# Patient Record
Sex: Female | Born: 1955 | Race: White | Hispanic: No | Marital: Married | Smoking: Never smoker
Health system: Southern US, Community
[De-identification: ages and names within clinical notes are randomized; demographics above are authoritative.]

## PROBLEM LIST (undated history)

## (undated) DIAGNOSIS — E785 Hyperlipidemia, unspecified: Secondary | ICD-10-CM

## (undated) DIAGNOSIS — Z5189 Encounter for other specified aftercare: Secondary | ICD-10-CM

## (undated) DIAGNOSIS — D34 Benign neoplasm of thyroid gland: Secondary | ICD-10-CM

## (undated) DIAGNOSIS — G43909 Migraine, unspecified, not intractable, without status migrainosus: Secondary | ICD-10-CM

## (undated) DIAGNOSIS — C801 Malignant (primary) neoplasm, unspecified: Secondary | ICD-10-CM

## (undated) DIAGNOSIS — R32 Unspecified urinary incontinence: Secondary | ICD-10-CM

## (undated) DIAGNOSIS — R011 Cardiac murmur, unspecified: Secondary | ICD-10-CM

## (undated) DIAGNOSIS — E271 Primary adrenocortical insufficiency: Secondary | ICD-10-CM

## (undated) HISTORY — DX: Encounter for other specified aftercare: Z51.89

## (undated) HISTORY — DX: Cardiac murmur, unspecified: R01.1

## (undated) HISTORY — DX: Benign neoplasm of thyroid gland: D34

## (undated) HISTORY — DX: Hyperlipidemia, unspecified: E78.5

## (undated) HISTORY — DX: Migraine, unspecified, not intractable, without status migrainosus: G43.909

## (undated) HISTORY — PX: ABDOMINAL HYSTERECTOMY: SHX81

## (undated) HISTORY — DX: Unspecified urinary incontinence: R32

## (undated) HISTORY — PX: LYMPHADENECTOMY: SHX15

---

## 2008-05-05 DIAGNOSIS — Z923 Personal history of irradiation: Secondary | ICD-10-CM

## 2008-05-05 HISTORY — DX: Personal history of irradiation: Z92.3

## 2008-05-05 HISTORY — PX: BREAST LUMPECTOMY: SHX2

## 2011-02-10 DIAGNOSIS — C50919 Malignant neoplasm of unspecified site of unspecified female breast: Secondary | ICD-10-CM | POA: Insufficient documentation

## 2011-02-18 DIAGNOSIS — F419 Anxiety disorder, unspecified: Secondary | ICD-10-CM | POA: Insufficient documentation

## 2011-02-18 DIAGNOSIS — E890 Postprocedural hypothyroidism: Secondary | ICD-10-CM | POA: Insufficient documentation

## 2011-02-18 DIAGNOSIS — E271 Primary adrenocortical insufficiency: Secondary | ICD-10-CM | POA: Insufficient documentation

## 2015-05-06 HISTORY — PX: WRIST FRACTURE SURGERY: SHX121

## 2016-01-06 ENCOUNTER — Encounter (HOSPITAL_COMMUNITY): Payer: Self-pay | Admitting: Emergency Medicine

## 2016-01-06 ENCOUNTER — Emergency Department (HOSPITAL_COMMUNITY): Payer: BLUE CROSS/BLUE SHIELD

## 2016-01-06 ENCOUNTER — Emergency Department (HOSPITAL_COMMUNITY)
Admission: EM | Admit: 2016-01-06 | Discharge: 2016-01-06 | Disposition: A | Discharge status: Home / Self Care | Payer: BLUE CROSS/BLUE SHIELD | Attending: Emergency Medicine | Admitting: Emergency Medicine

## 2016-01-06 DIAGNOSIS — S6992XA Unspecified injury of left wrist, hand and finger(s), initial encounter: Secondary | ICD-10-CM | POA: Diagnosis present

## 2016-01-06 DIAGNOSIS — S52572A Other intraarticular fracture of lower end of left radius, initial encounter for closed fracture: Secondary | ICD-10-CM | POA: Diagnosis not present

## 2016-01-06 DIAGNOSIS — Y9301 Activity, walking, marching and hiking: Secondary | ICD-10-CM | POA: Insufficient documentation

## 2016-01-06 DIAGNOSIS — Y999 Unspecified external cause status: Secondary | ICD-10-CM | POA: Diagnosis not present

## 2016-01-06 DIAGNOSIS — Y929 Unspecified place or not applicable: Secondary | ICD-10-CM | POA: Insufficient documentation

## 2016-01-06 DIAGNOSIS — Z853 Personal history of malignant neoplasm of breast: Secondary | ICD-10-CM | POA: Diagnosis not present

## 2016-01-06 DIAGNOSIS — W010XXA Fall on same level from slipping, tripping and stumbling without subsequent striking against object, initial encounter: Secondary | ICD-10-CM | POA: Diagnosis not present

## 2016-01-06 DIAGNOSIS — S62102A Fracture of unspecified carpal bone, left wrist, initial encounter for closed fracture: Secondary | ICD-10-CM

## 2016-01-06 HISTORY — DX: Malignant (primary) neoplasm, unspecified: C80.1

## 2016-01-06 HISTORY — DX: Primary adrenocortical insufficiency: E27.1

## 2016-01-06 MED ORDER — TRAMADOL HCL 50 MG PO TABS: 50.0000 mg | ORAL_TABLET | Freq: Four times a day (QID) | ORAL | 0 refills | Status: DC | PRN

## 2016-01-06 MED ORDER — ONDANSETRON 4 MG PO TBDP: 4.0000 mg | ORAL_TABLET | Freq: Three times a day (TID) | ORAL | 0 refills | Status: DC | PRN

## 2016-01-06 MED ORDER — IBUPROFEN 400 MG PO TABS: 400.0000 mg | ORAL_TABLET | Freq: Four times a day (QID) | ORAL | 0 refills | Status: AC | PRN

## 2016-01-06 MED ORDER — ACETAMINOPHEN 325 MG PO TABS
650.0000 mg | ORAL_TABLET | Freq: Once | ORAL | Status: AC
  Administered 2016-01-06: 650 mg via ORAL
  Filled 2016-01-06: qty 2

## 2016-01-06 MED ORDER — ONDANSETRON 4 MG PO TBDP: 4.0000 mg | ORAL_TABLET | Freq: Three times a day (TID) | ORAL | 0 refills | Status: AC | PRN

## 2016-01-06 MED ORDER — ACETAMINOPHEN ER 650 MG PO TBCR: 650.0000 mg | EXTENDED_RELEASE_TABLET | Freq: Three times a day (TID) | ORAL | 0 refills | Status: DC | PRN

## 2016-01-06 NOTE — ED Provider Notes (Signed)
[facility] DEPT Provider Note   CSN: [id number] Arrival date & time: 01/06/16  1444     History   Chief Complaint Chief Complaint  Patient presents with  . Fall  . Arm Injury    HPI Tiffany Costa is a 60 y.o. female.  HPI Pt comes in post fall. Pt had a trip and fell on to an outstretched hand. Pt had instant swelling and pain to the wrist L side, she is R handed. Denies pain elsewhere. No head trauma. Ambulating w/o trouble.   Past Medical History:  Diagnosis Date  . 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 Cancer [facility])    breast cancer    There are no active problems to display for this patient.   Past Surgical History:  Procedure Laterality Date  . LYMPHADENECTOMY      OB History    No data available       Home Medications    Prior to Admission medications   Medication Sig Start Date End Date Taking? Authorizing Provider  acetaminophen (TYLENOL 8 HOUR) 650 MG CR tablet Take 1 tablet (650 mg total) by mouth every 8 (eight) hours as needed for pain. 01/06/16   Varney Biles, MD  ibuprofen (ADVIL,MOTRIN) 400 MG tablet Take 1 tablet (400 mg total) by mouth every 6 (six) hours as needed. 01/07/16   Varney Biles, MD  ondansetron (ZOFRAN ODT) 4 MG disintegrating tablet Take 1 tablet (4 mg total) by mouth every 8 (eight) hours as needed for nausea or vomiting. 01/06/16   Varney Biles, MD  ondansetron (ZOFRAN ODT) 4 MG disintegrating tablet Take 1 tablet (4 mg total) by mouth every 8 (eight) hours as needed for nausea or vomiting. 01/06/16   Varney Biles, MD  traMADol (ULTRAM) 50 MG tablet Take 1 tablet (50 mg total) by mouth every 6 (six) hours as needed. 01/06/16   Varney Biles, MD    Family History History reviewed. No pertinent family history.  Social History Social History  Substance Use Topics  . Smoking status: Never Smoker  . Smokeless tobacco: Never Used  . Alcohol use No     Allergies   Amoxicillin and Codeine   Review of Systems Review of Systems    Constitutional: Positive for activity change.  Musculoskeletal: Positive for arthralgias and myalgias.  Skin: Positive for wound. Negative for rash.  Neurological: Negative for headaches.  Hematological: Does not bruise/bleed easily.     Physical Exam Updated Vital Signs BP 136/75 (BP Location: Right Leg)   Pulse 77   Temp 98.1 F (36.7 C) (Oral)   Resp 16   SpO2 98%   Physical Exam  Constitutional: She is oriented to person, place, and time. She appears well-developed and well-nourished.  HENT:  Head: Normocephalic and atraumatic.  Eyes: EOM are normal. Pupils are equal, round, and reactive to light.  Neck: Neck supple.  Cardiovascular: Normal rate, regular rhythm and normal heart sounds.   No murmur heard. Pulmonary/Chest: Effort normal. No respiratory distress.  Abdominal: Soft. She exhibits no distension. There is no tenderness. There is no rebound and no guarding.  Musculoskeletal: She exhibits edema and tenderness.  L wrist tenderness  Neurological: She is alert and oriented to person, place, and time.  Skin: Skin is warm and dry.  Nursing note and vitals reviewed.    ED Treatments / Results  Labs (all labs ordered are listed, but only abnormal results are displayed) Labs Reviewed - No data to display  EKG  EKG Interpretation None  Radiology Dg Wrist Complete Left  Result Date: 01/06/2016 CLINICAL DATA:  Fall, left wrist pain EXAM: LEFT WRIST - COMPLETE 3+ VIEW COMPARISON:  None. FINDINGS: Comminuted, intra-articular distal radial fracture. Suspected nondisplaced ulnar styloid fracture. Associated soft tissue swelling. Degenerative changes of the 1st carpometacarpal joint. IMPRESSION: Comminuted intra-articular distal radial fracture. Nondisplaced ulnar styloid fracture. Electronically Signed   By: Julian Hy M.D.   On: 01/06/2016 15:38    Procedures Procedures (including critical care time)  Medications Ordered in ED Medications   acetaminophen (TYLENOL) tablet 650 mg (650 mg Oral Given 01/06/16 1603)     Initial Impression / Assessment and Plan / ED Course  I have reviewed the triage vital signs and the nursing notes.  Pertinent labs & imaging results that were available during my care of the patient were reviewed by me and considered in my medical decision making (see chart for details).  Clinical Course   Pt with Denning injury and resultant wrist fracture. We will place her in a sugar tong splint and d/c with hand surgery f/u. Pt has nausea with codeine and doesn't want anything too strong, we will give ultram and tylenol 650, and she can take motrin after the 1st day of injury.  Final Clinical Impressions(s) / ED Diagnoses   Final diagnoses:  Wrist fracture, closed, left, initial encounter    New Prescriptions Discharge Medication List as of 01/06/2016  4:31 PM    START taking these medications   Details  acetaminophen (TYLENOL 8 HOUR) 650 MG CR tablet Take 1 tablet (650 mg total) by mouth every 8 (eight) hours as needed for pain., Starting Sun 01/06/2016, Print    ibuprofen (ADVIL,MOTRIN) 400 MG tablet Take 1 tablet (400 mg total) by mouth every 6 (six) hours as needed., Starting Mon 01/07/2016, Print    ondansetron (ZOFRAN ODT) 4 MG disintegrating tablet Take 1 tablet (4 mg total) by mouth every 8 (eight) hours as needed for nausea or vomiting., Starting Sun 01/06/2016, Print    traMADol (ULTRAM) 50 MG tablet Take 1 tablet (50 mg total) by mouth every 6 (six) hours as needed., Starting Sun 01/06/2016, Print         Varney Biles, MD 01/06/16 [id number]

## 2016-01-06 NOTE — ED Notes (Signed)
Bed: WA25 Expected date:  Expected time:  Means of arrival:  Comments: EMS  Fall 

## 2016-01-06 NOTE — Discharge Instructions (Signed)
Please see the doctor as requested.

## 2016-01-06 NOTE — ED Notes (Signed)
Patient ambulatory to lobby with family. NAD noted.

## 2016-01-06 NOTE — ED Triage Notes (Signed)
Pt tripped over tree root when walking to the store. Pt fell backwards and tried to catch self with L arm. Pt has L wrist pain, splinted by EMS. Feels better after splint and ice.

## 2018-01-27 ENCOUNTER — Encounter: Payer: Self-pay | Admitting: Family Medicine

## 2018-01-27 ENCOUNTER — Ambulatory Visit (INDEPENDENT_AMBULATORY_CARE_PROVIDER_SITE_OTHER): Payer: BLUE CROSS/BLUE SHIELD | Admitting: Family Medicine

## 2018-01-27 VITALS — BP 130/78 | HR 100 | Temp 100.1°F | Ht 59.0 in | Wt 122.6 lb

## 2018-01-27 DIAGNOSIS — E890 Postprocedural hypothyroidism: Secondary | ICD-10-CM | POA: Diagnosis not present

## 2018-01-27 DIAGNOSIS — E271 Primary adrenocortical insufficiency: Secondary | ICD-10-CM | POA: Diagnosis not present

## 2018-01-27 DIAGNOSIS — J0100 Acute maxillary sinusitis, unspecified: Secondary | ICD-10-CM | POA: Diagnosis not present

## 2018-01-27 MED ORDER — DOXYCYCLINE HYCLATE 100 MG PO TABS: 100.0000 mg | ORAL_TABLET | Freq: Two times a day (BID) | ORAL | 0 refills | Status: AC

## 2018-01-27 NOTE — Progress Notes (Signed)
Subjective:    Patient ID: Tiffany Costa, female    DOB: [birth year], 62 y.o.   MRN: [id number]  HPI   Patient presents to clinic to establish with new PCP.  She recently moved to [place] from [place].  Patient's medical history, surgical history, social history, family history updated accordingly in chart.  Patient has history of Addison's disease (autoimmune type) for which she had radioactive iodine treatment and now has hypothyroidism due to this.  Patient would like a referral to endocrinologist in the local area, previously was seeing someone in [place], but that would be to forward dry for now.  Patient also had breast cancer, she is currently taking tamoxifen to prevent recurrence of breast cancer.  She follows with oncology regularly for this.  Mammogram is scheduled for October 2019.  Today she complains of severe sinus pressure, sinus headache, nasal drainage, feeling rundown.  States she has had the symptoms for little over a week, has tried some over-the-counter cold medication without much relief.   Patient Active Problem List   Diagnosis Date Noted  . 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 Anxiety 02/18/2011  . 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 Breast cancer, stage 1 ([place]) 02/10/2011   Past Medical History:  Diagnosis Date  . 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 Blood transfusion without reported diagnosis   . Cancer [facility])    breast cancer  . Heart murmur   . Hyperfunctioning follicular adenoma of thyroid gland   . Hyperlipidemia   . Migraines   . Urine incontinence    Past Surgical History:  Procedure Laterality Date  . ABDOMINAL HYSTERECTOMY    . LYMPHADENECTOMY    . WRIST FRACTURE SURGERY  2017   Family History  Problem Relation Age of Onset  . Cancer Mother   . COPD Father   . Cancer Maternal Grandmother   . Heart disease Paternal Grandfather    Review of Systems  Constitutional: Negative for chills, fatigue and fever.  HENT:  positive for congestion, ear pain, sinus pain, sinus headache, nasal drainage.    Eyes: Negative.   Respiratory: Negative for cough, shortness of breath and wheezing.   Cardiovascular: Negative for chest pain, palpitations and leg swelling.  Gastrointestinal: Negative for abdominal pain, diarrhea, nausea and vomiting.  Genitourinary: Negative for dysuria, frequency and urgency.  Musculoskeletal: Negative for arthralgias and myalgias.  Skin: Negative for color change, pallor and rash.  Neurological: Negative for syncope, light-headedness and headaches.  Psychiatric/Behavioral: The patient is not nervous/anxious.       Objective:   Physical Exam  Constitutional: She is oriented to person, place, and time. She appears well-developed and well-nourished. No distress.  HENT:  Head: Normocephalic and atraumatic.  Fullness bilateral TMs. +yellow nasal discharge. +post nasal drip.   Eyes: EOM are normal. No scleral icterus.  Neck: Neck supple. No tracheal deviation present.  Cardiovascular: Normal rate and regular rhythm.  Pulmonary/Chest: Effort normal and breath sounds normal.  Musculoskeletal: She exhibits no edema.  Gait normal  Neurological: She is alert and oriented to person, place, and time.  Skin: Skin is warm and dry. No pallor.  Psychiatric: She has a normal mood and affect. Her behavior is normal.  Nursing note and vitals reviewed.     Vitals:   01/27/18 1028  BP: 130/78  Pulse: 100  Temp: 100.1 F (37.8 C)  SpO2: 97%   Assessment & Plan:   Sinusitis-patient will take doxycycline twice daily for 10 days.  Also advised she can use  over-the-counter allergy medicine such as a Claritin to help nasal congestion symptoms.  Rest, increase fluids, do good handwashing.  Addison's disease/post ablative hypothyroidism- we will give patient referral to endocrinologist in local area due to her current endocrinologist being too far away from her new home.  Patient will follow-up as  needed if any issues arise, otherwise will come back in 1 year.  Suggested she did follow-up in 3 to 6 months however patient would prefer to come back once annually and as needed.

## 2018-01-28 ENCOUNTER — Encounter: Payer: Self-pay | Admitting: Family Medicine

## 2018-01-28 DIAGNOSIS — J0100 Acute maxillary sinusitis, unspecified: Secondary | ICD-10-CM | POA: Insufficient documentation

## 2019-03-30 ENCOUNTER — Other Ambulatory Visit: Payer: Self-pay

## 2019-03-30 DIAGNOSIS — Z20822 Contact with and (suspected) exposure to covid-19: Secondary | ICD-10-CM

## 2019-03-31 LAB — NOVEL CORONAVIRUS, NAA: SARS-CoV-2, NAA: NOT DETECTED

## 2019-06-27 ENCOUNTER — Ambulatory Visit: Payer: BLUE CROSS/BLUE SHIELD

## 2019-06-27 ENCOUNTER — Ambulatory Visit: Payer: Medicare Other | Attending: Family Medicine

## 2019-06-27 DIAGNOSIS — Z23 Encounter for immunization: Secondary | ICD-10-CM | POA: Insufficient documentation

## 2019-06-27 NOTE — Progress Notes (Signed)
   Covid-19 Vaccination Clinic  Name:  Tiffany Costa    MRN: [id number] DOB: 01/19/1956  06/27/2019  [name] was observed post Covid-19 immunization for 15 minutes without incidence. She was provided with Vaccine Information Sheet and instruction to access the V-Safe system.   [name] was instructed to call 911 with any severe reactions post vaccine: Marland Kitchen Difficulty breathing  . Swelling of your face and throat  . A fast heartbeat  . A bad rash all over your body  . Dizziness and weakness    Immunizations Administered    Name Date Dose VIS Date Route   Moderna COVID-19 Vaccine 06/27/2019  3:59 PM 0.5 mL 04/05/2019 Intramuscular   Manufacturer: Moderna   Lot: [id number]   [id number]

## 2019-07-26 ENCOUNTER — Ambulatory Visit: Payer: Medicare Other | Attending: Internal Medicine

## 2019-07-26 DIAGNOSIS — Z23 Encounter for immunization: Secondary | ICD-10-CM

## 2019-07-26 NOTE — Progress Notes (Signed)
   Covid-19 Vaccination Clinic  Name:  Maryjose Rodebaugh    MRN: [id number] DOB: Nov 13, 1955  07/26/2019  Ms. Cappella was observed post Covid-19 immunization for 15 minutes without incident. She was provided with Vaccine Information Sheet and instruction to access the V-Safe system.   Ms. Riano was instructed to call 911 with any severe reactions post vaccine: Marland Kitchen Difficulty breathing  . Swelling of face and throat  . A fast heartbeat  . A bad rash all over body  . Dizziness and weakness   Immunizations Administered    Name Date Dose VIS Date Route   Moderna COVID-19 Vaccine 07/26/2019  4:02 PM 0.5 mL 04/05/2019 Intramuscular   Manufacturer: Moderna   Lot: [id number]   [place]VO:[id number]

## 2019-11-25 ENCOUNTER — Other Ambulatory Visit: Payer: Self-pay | Admitting: Orthopedic Surgery

## 2019-11-25 DIAGNOSIS — M25511 Pain in right shoulder: Secondary | ICD-10-CM

## 2019-11-30 ENCOUNTER — Other Ambulatory Visit: Payer: Self-pay | Admitting: Orthopedic Surgery

## 2019-11-30 DIAGNOSIS — M25511 Pain in right shoulder: Secondary | ICD-10-CM

## 2019-12-01 ENCOUNTER — Other Ambulatory Visit: Payer: Self-pay | Admitting: Orthopedic Surgery

## 2019-12-01 DIAGNOSIS — M25511 Pain in right shoulder: Secondary | ICD-10-CM

## 2020-02-06 ENCOUNTER — Other Ambulatory Visit: Payer: Self-pay | Admitting: Internal Medicine

## 2020-02-06 DIAGNOSIS — Z1231 Encounter for screening mammogram for malignant neoplasm of breast: Secondary | ICD-10-CM

## 2020-02-27 ENCOUNTER — Telehealth: Payer: Self-pay | Admitting: Family Medicine

## 2020-02-27 NOTE — Telephone Encounter (Signed)
Provider removed as PCP.

## 2020-02-27 NOTE — Telephone Encounter (Signed)
-----   Message from Salvatore Marvel sent at 02/24/2020  1:03 PM EDT ----- Regarding: Remove PCP [place] pt. Please remove.  Thank you and Happy Friday!  Ebony Hail

## 2020-03-12 ENCOUNTER — Ambulatory Visit: Payer: Medicare Other | Attending: Internal Medicine

## 2020-03-12 DIAGNOSIS — Z23 Encounter for immunization: Secondary | ICD-10-CM

## 2020-03-12 NOTE — Progress Notes (Signed)
   Covid-19 Vaccination Clinic  Name:  Tiffany Costa    MRN: [id number] DOB: 15-Oct-1955  03/12/2020  Ms. Tiffany Costa was observed post Covid-19 immunization for 15 minutes without incident. She was provided with Vaccine Information Sheet and instruction to access the V-Safe system.   Ms. Tiffany Costa was instructed to call 911 with any severe reactions post vaccine: Marland Kitchen Difficulty breathing  . Swelling of face and throat  . A fast heartbeat  . A bad rash all over body  . Dizziness and weakness

## 2020-03-13 ENCOUNTER — Ambulatory Visit: Payer: Medicare Other

## 2020-04-23 ENCOUNTER — Ambulatory Visit
Admission: RE | Admit: 2020-04-23 | Discharge: 2020-04-23 | Disposition: A | Discharge status: Home / Self Care | Payer: Medicare Other | Source: Ambulatory Visit | Attending: Internal Medicine | Admitting: Internal Medicine

## 2020-04-23 ENCOUNTER — Other Ambulatory Visit: Payer: Self-pay

## 2020-04-23 DIAGNOSIS — Z1231 Encounter for screening mammogram for malignant neoplasm of breast: Secondary | ICD-10-CM

## 2021-01-18 IMAGING — MG DIGITAL SCREENING BILAT W/ TOMO W/ CAD
8 series · 9 of 24 positions shown · non-contrast
Comparison: Previous exam(s).

CLINICAL DATA: Screening.

EXAM:
DIGITAL SCREENING BILATERAL MAMMOGRAM WITH TOMO AND CAD

[L MLO synth-2D]
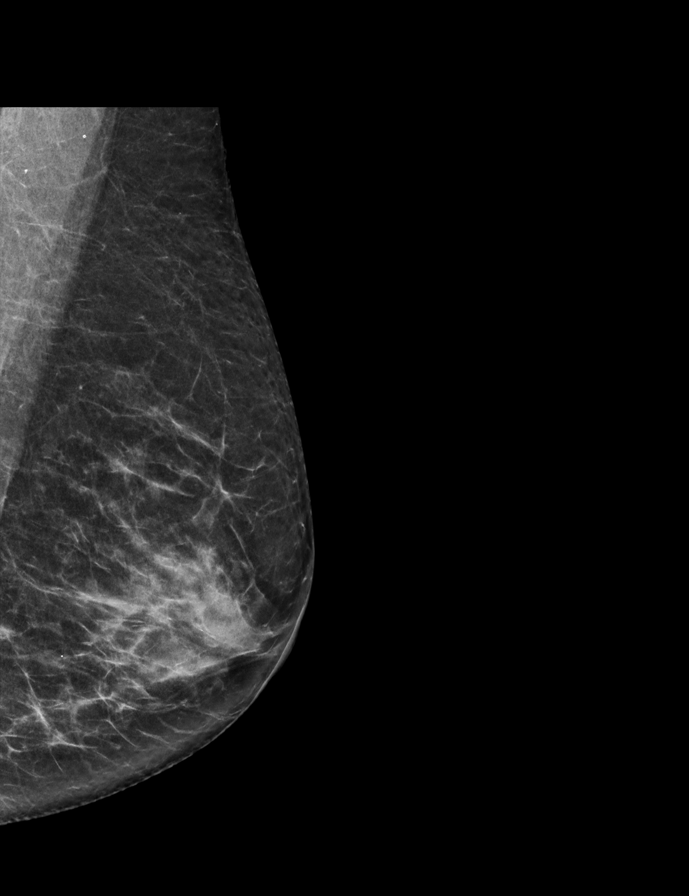

[L CC synth-2D]
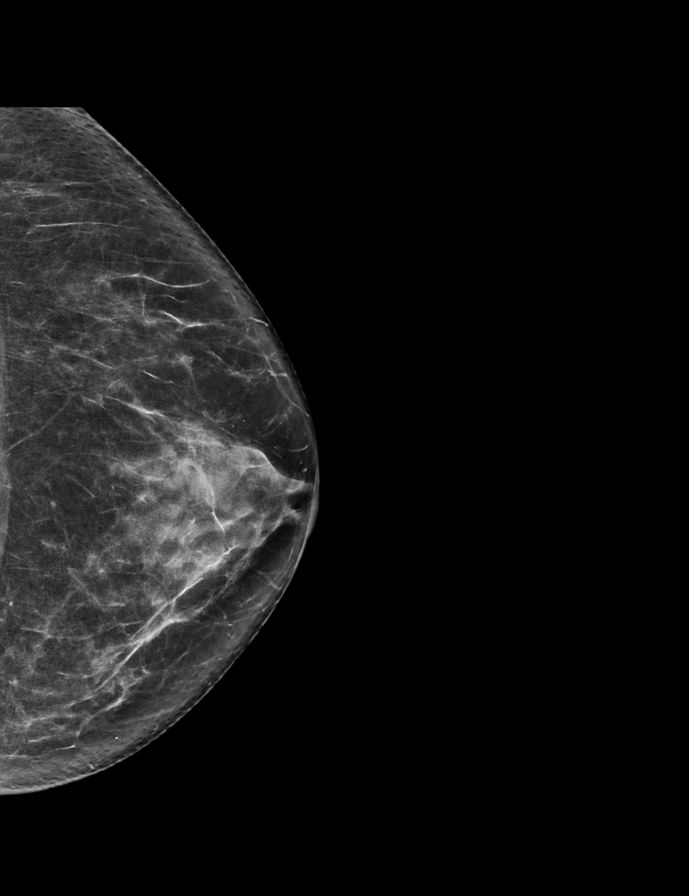

[R CC synth-2D]
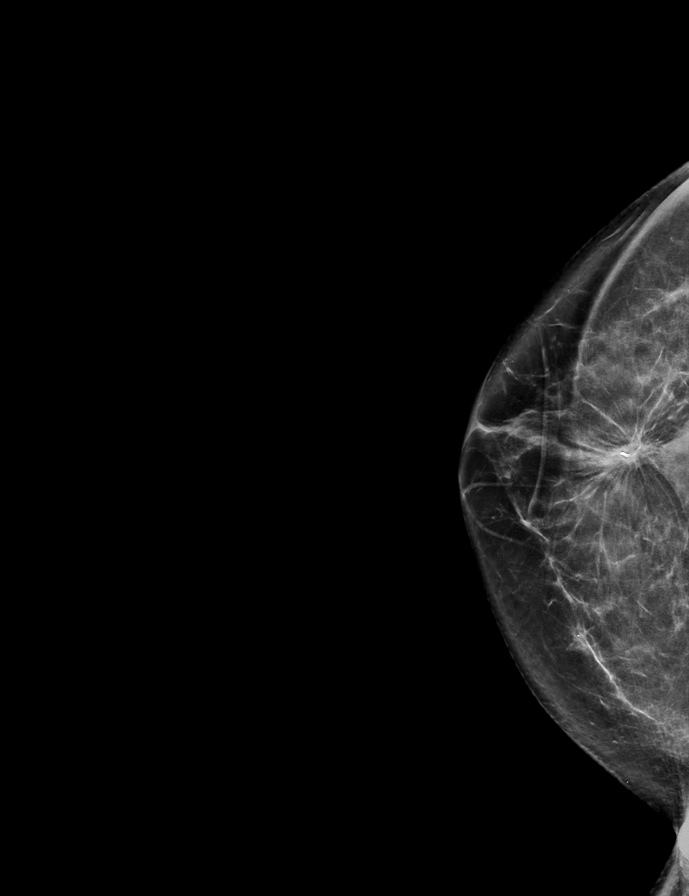

[R MLO synth-2D]
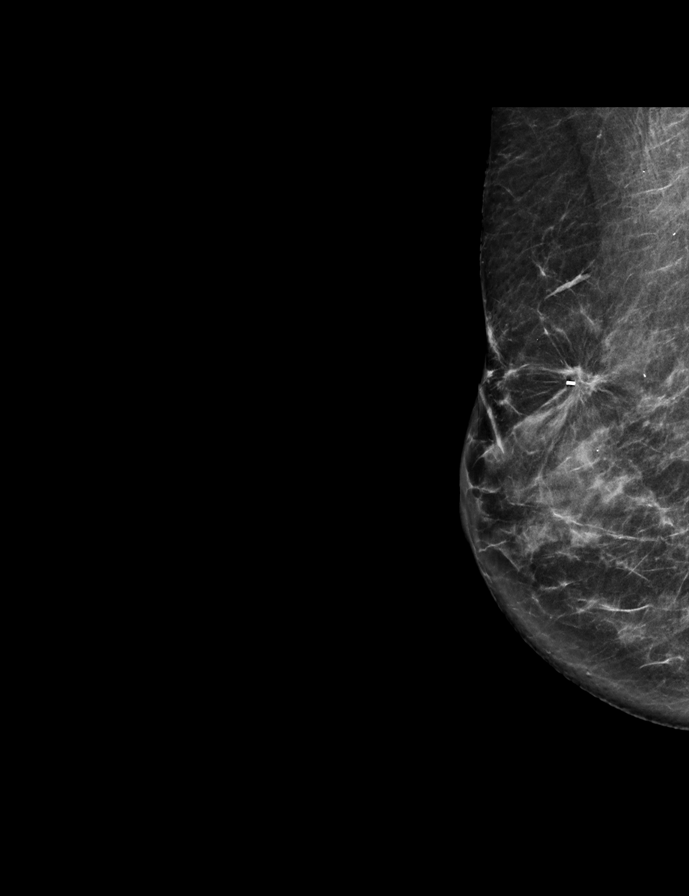

[R CC tomo · 2 of 65 frames shown]
[frame 21/65]
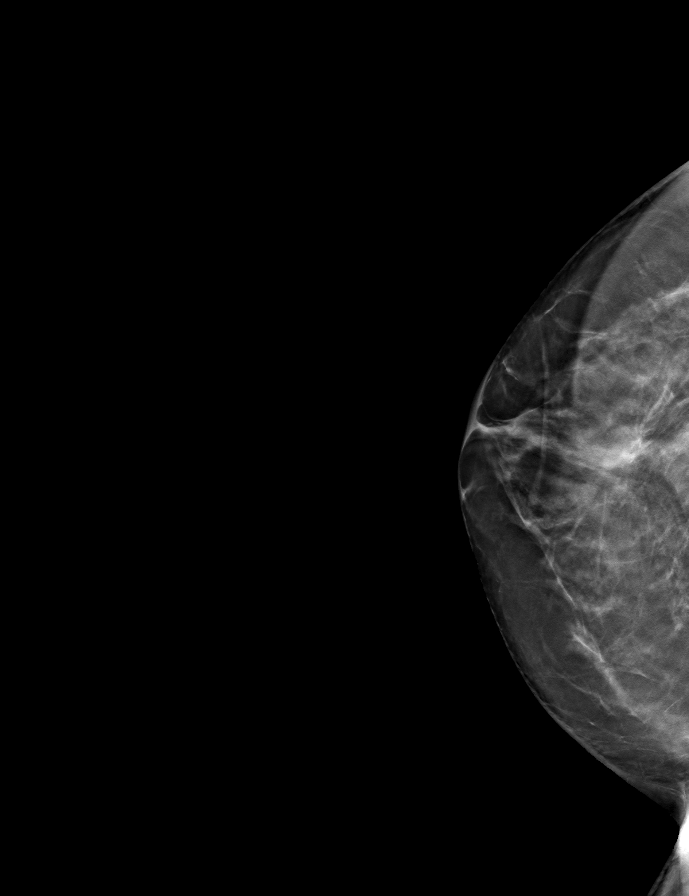
[frame 33/65]
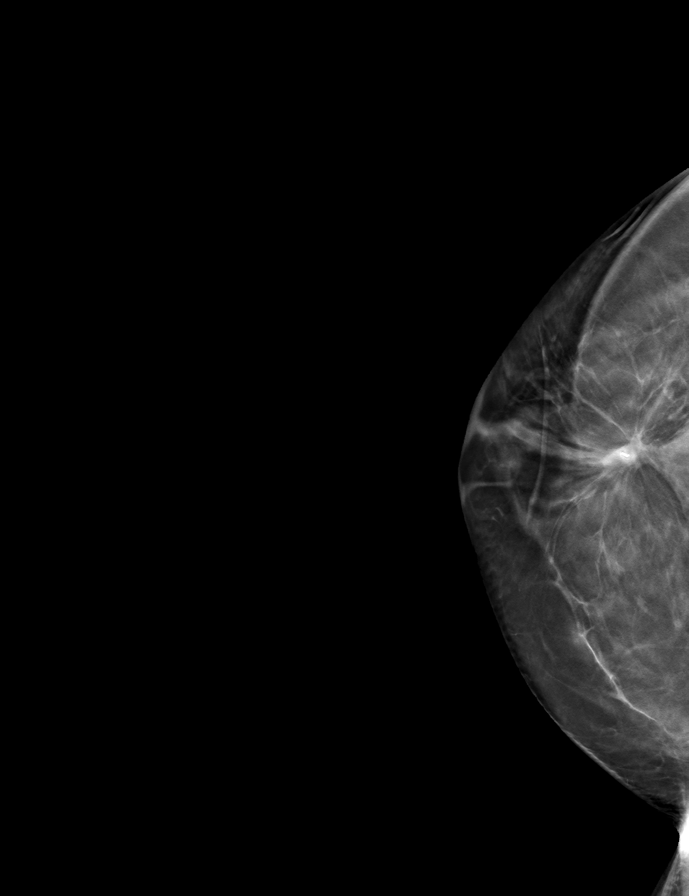

[L MLO tomo · tomo slice 33/64.0]
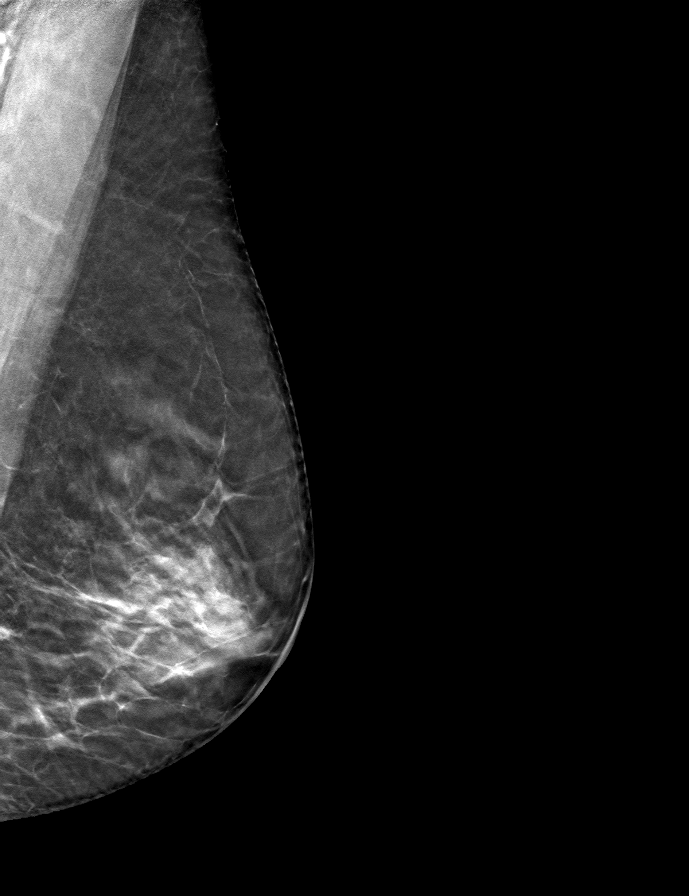

[R MLO tomo · tomo slice 32/63.0]
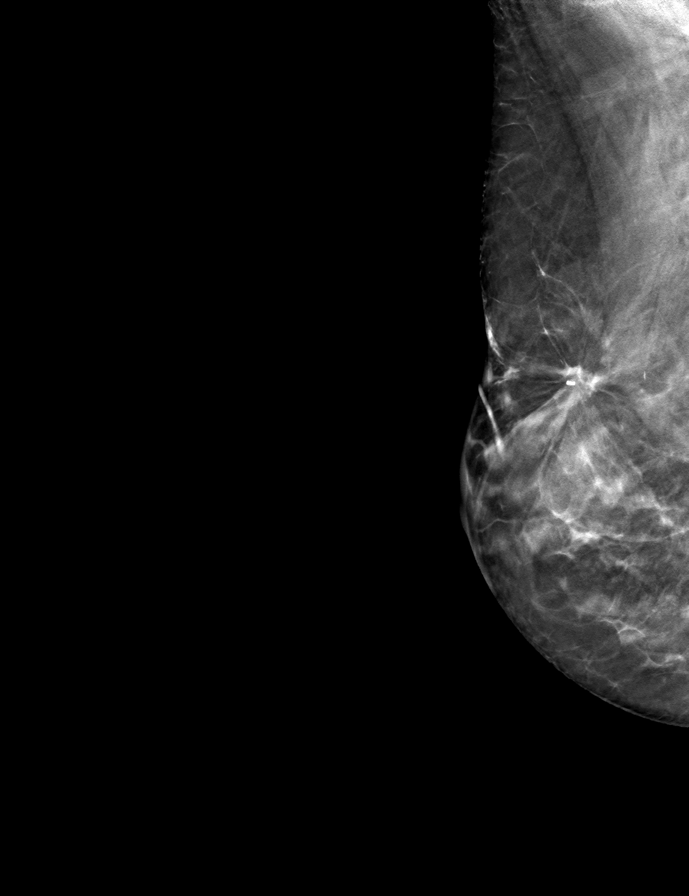

[L CC tomo · tomo slice 33/65.0]
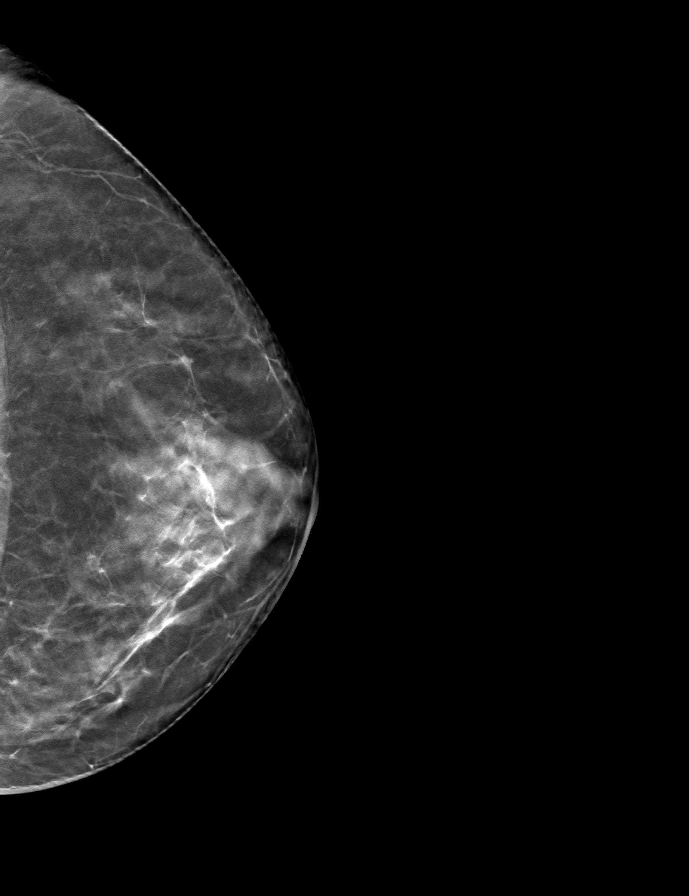

[9 of 24 positions shown; findings below may reference images not displayed]

ACR Breast Density Category c: The breast tissue is heterogeneously
dense, which may obscure small masses.
FINDINGS: There are no findings suspicious for malignancy. Images were
processed with CAD.
IMPRESSION: No mammographic evidence of malignancy. A result letter of this
screening mammogram will be mailed directly to the patient.

RECOMMENDATION:
Screening mammogram in one year. (Code:[id number])

BI-RADS CATEGORY  1: Negative.

## 2021-04-02 ENCOUNTER — Other Ambulatory Visit: Payer: Self-pay | Admitting: Internal Medicine

## 2021-04-02 DIAGNOSIS — Z1231 Encounter for screening mammogram for malignant neoplasm of breast: Secondary | ICD-10-CM

## 2021-05-08 ENCOUNTER — Ambulatory Visit
Admission: RE | Admit: 2021-05-08 | Discharge: 2021-05-08 | Disposition: A | Discharge status: Home / Self Care | Payer: Medicare Other | Source: Ambulatory Visit | Attending: Internal Medicine | Admitting: Internal Medicine

## 2021-05-08 DIAGNOSIS — Z1231 Encounter for screening mammogram for malignant neoplasm of breast: Secondary | ICD-10-CM

## 2021-05-14 ENCOUNTER — Other Ambulatory Visit: Payer: Self-pay | Admitting: Internal Medicine

## 2021-05-14 DIAGNOSIS — R109 Unspecified abdominal pain: Secondary | ICD-10-CM

## 2021-05-29 ENCOUNTER — Other Ambulatory Visit: Payer: Self-pay

## 2021-05-29 ENCOUNTER — Ambulatory Visit
Admission: RE | Admit: 2021-05-29 | Discharge: 2021-05-29 | Disposition: A | Discharge status: Home / Self Care | Payer: Medicare Other | Source: Ambulatory Visit | Attending: Internal Medicine | Admitting: Internal Medicine

## 2021-05-29 DIAGNOSIS — R109 Unspecified abdominal pain: Secondary | ICD-10-CM | POA: Insufficient documentation

## 2021-05-29 LAB — POCT I-STAT CREATININE: Creatinine, Ser: 0.8 mg/dL (ref 0.44–1.00)

## 2021-05-29 MED ORDER — IOHEXOL 300 MG/ML  SOLN
100.0000 mL | Freq: Once | INTRAMUSCULAR | Status: AC | PRN
  Administered 2021-05-29: 16:00:00 100 mL via INTRAVENOUS

## 2021-08-19 ENCOUNTER — Telehealth: Payer: Self-pay | Admitting: Physical Therapy

## 2021-08-19 NOTE — Telephone Encounter (Signed)
Called pt to inquiry about whether she would like to come in today instead of tomorrow to complete her evaluation because of openings in the schedule. Pt did not answer, so VM left instructing pt to call back if she would like to come in today.  ?

## 2021-08-20 ENCOUNTER — Encounter: Payer: Self-pay | Admitting: Physical Therapy

## 2021-08-20 ENCOUNTER — Ambulatory Visit: Payer: Medicare Other | Attending: Sports Medicine | Admitting: Physical Therapy

## 2021-08-20 ENCOUNTER — Other Ambulatory Visit: Payer: Self-pay

## 2021-08-20 DIAGNOSIS — M546 Pain in thoracic spine: Secondary | ICD-10-CM | POA: Diagnosis present

## 2021-08-20 DIAGNOSIS — M6281 Muscle weakness (generalized): Secondary | ICD-10-CM | POA: Insufficient documentation

## 2021-08-20 DIAGNOSIS — G8929 Other chronic pain: Secondary | ICD-10-CM | POA: Insufficient documentation

## 2021-08-20 NOTE — Therapy (Signed)
?OUTPATIENT PHYSICAL THERAPY THORACOLUMBAR EVALUATION ? ? ?Patient Name: Tiffany Costa ?MRN: [id number] ?DOB:[birth year], 66 y.o., female ?Today's Date: 08/20/2021 ? ? PT End of Session - 08/20/21 1214   ? ? Visit Number 1   ? Number of Visits 16   ? Date for PT Re-Evaluation 10/15/21   ? Authorization Type Medicare 2023   ? PT Start Time 1020   ? PT Stop Time 1100   ? PT Time Calculation (min) 40 min   ? Activity Tolerance Patient tolerated treatment well   ? Behavior During Therapy [facility] for tasks assessed/performed   ? ?  ?  ? ?  ? ? ?Past Medical History:  ?Diagnosis Date  ? 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 Blood transfusion without reported diagnosis   ? Cancer [facility])   ? breast cancer  ? Heart murmur   ? Hyperfunctioning follicular adenoma of thyroid gland   ? Hyperlipidemia   ? Migraines   ? Personal history of radiation therapy 2010  ? Urine incontinence   ? ?Past Surgical History:  ?Procedure Laterality Date  ? ABDOMINAL HYSTERECTOMY    ? BREAST LUMPECTOMY Right 2010  ? LYMPHADENECTOMY    ? WRIST FRACTURE SURGERY  2017  ? ?Patient Active Problem List  ? Diagnosis Date Noted  ? Acute non-recurrent maxillary sinusitis 01/28/2018  ? 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 Anxiety 02/18/2011  ? 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 Breast cancer, stage 1 ([place]) 02/10/2011  ? ? ?PCP: Gladstone Lighter, MD ? ?REFERRING PROVIDER: Diamond Nickel, DO ? ?REFERRING DIAG: Right sided thoracic back pain  ? ?THERAPY DIAG:  ?Chronic left-sided low back pain without sciatica ? ?Muscle weakness (generalized) ? ?ONSET DATE: 08/03/20 ? ?SUBJECTIVE:                                                                                                                                                                                          ? ?SUBJECTIVE STATEMENT: ?Pt reports that pain is mostly in her left flank and that it mostly feels better when laying on her left side. She is currently taking Celebrex to treat  the pain especially at night. She has a h/o of sigmoid scoliosis and she was diagnosed in the 8th grade. She reports having a couple of falls, but she does not think that her low back pain occurred from the falls. Pt describes a severe fall in July 2 years ago, at a  pool where she fell into a sink, but she did not sustain any serious injuries. She also has numbness and tingling in the lower left side and fingers of left  hand including middle, ring, and medial half of ring finger. She also feels itchiness in left side above left PSIS  ? ? ?PERTINENT HISTORY:  ?Kub[place] 06/25/21  ? ?[name] is a 66 y.o. female that presents to clinic today for evaluation and management of back pain at the referral of Radhika, Tressia Miners, MD. She has previously been established with our group but this is a new problem.  ? ?At the time of this visit, I reviewed her most recent evaluation by her PCP from 05/13/2021 for chronic low back and flank pain. She had previously been treated for a UTI in October 2022. She is known to have scoliosis. At that visit, she was recommended CT abdomen/pelvis with contrast which showed some diverticulosis and thoracolumbar scoliosis. She was called about these results and so she was recommended to have x-rays of the spine. She was also put on gabapentin. She had entire spine x-rays on 06/06/2021 which showed sigmoid thoracolumbar scoliosis with multiple level degenerative disc and facet changes. She was referred to orthopedics for this issue. Her most recent labs from 01/16/2021 show creatinine 0.8, normal electrolytes, normal TSH, normal vitamin D. She did have a urinalysis on 02/13/2021 that was slightly cloudy with positive nitrites, trace leukocyte Estrace, many bacteria, but no red blood cells/ketones/glucose. ? ?Her pain began suddenly approximately 8 months ago with no acute trauma or injury. She denies any previous accident, injury, trauma, fall, strain to this area. She does note a remote  fall where she hit her face and wonders if this could have exacerbated her pains. The pain is located over the left flank and radiates intermittently to the left abdomen. She denies any radiation of symptoms into her legs She describes her pain as intermittent, worsening, sharp, dull, throbbing, aching, nagging, intense, stabbing, clenching, and feeling like a "spasm". It is aggravated by laying flat on her back at night and is worse at night. She currently rates pain severity as a 6/10. She reports associated pain at night. She denies associated swelling, bruising, skin color change, locking, catching, clicking, instability, limping, difficulty walking, numbness/tingling, weakness, fever/chills, nausea, night sweats, weight loss. She has tried Tylenol and Celecoxib. She also notes that using a more firm mattress has helped at times. She took gabapentin for 1 week but felt that it made her too drowsy in the morning so she stopped it.  ? ?PAIN:  ?Are you having pain? Yes: NPRS scale: 8/10 ?Pain location: Left sided hip and low back pain ?Pain description: A little bit of everything; muscular tension and clenching; cramping; feels itchy  ?Aggravating factors: Laying on opposite side (right) or laying on back  ?Relieving factors: Celebrex; needs to take before bed so that she can sleep better.  ? ? ?PRECAUTIONS: None ? ?WEIGHT BEARING RESTRICTIONS No ? ?FALLS:  ?Has patient fallen in last 6 months? No; but did trip going up stairs. Has had h/o falls in 2017 and 2021.  ? ?LIVING ENVIRONMENT: ?Lives with: lives with their spouse ?Lives in: House/apartment ?Stairs: No ?Has following equipment at home: None ? ?OCCUPATION: Not currently working, but takes care of grandson frequently  ? ?PLOF: Independent ? ?PATIENT GOALS Patient wants to avoid surgery and to see if PT will help.  ? ? ?OBJECTIVE:  ? ?            VITALS: BP 162/76 HR 63 SpO2 100   ?             ?DIAGNOSTIC FINDINGS:  ?EXAM: ?Lumbar Spine  Radiographs - 2 views  (AP, Lateral) performed 06/06/2021 ? ?FINDINGS:  ?There are 5 lumbar type vertebrae without evidence of compression injury. Normal lumbar lordosis. Moderate, sigmoid thoracolumbar scoliosis scoliosis. No listhesis. Multilevel degenerative disc and facet changes.  ? ? ?PATIENT SURVEYS:  ?FOTO 60/64 ? ?SCREENING FOR RED FLAGS: ?Bowel or bladder incontinence: No ?Spinal tumors: No ?Cauda equina syndrome: No ?Compression fracture: No ?Abdominal aneurysm: No ? ?COGNITION: ? Overall cognitive status: Within functional limits for tasks assessed   ?  ?SENSATION: ?Describes that left sided thoracic area around T10 and T11 feels itchy intermittently  ? ?MUSCLE LENGTH: ?Hamstrings: Right 90 deg; Left 90 deg ?Thomas test: positive bilateral  ? ?POSTURE:  ?Forward flexed rounded shoulders with slight lateral lean to left  ? ?PALPATION: ?10th and 11th Ribs on left side along with T10th and T11th spinous vertebrae  ? ?LUMBAR ROM:  ? ?Active  A/PROM  ?08/20/2021  ?Flexion 100%  ?Extension 100%  ?Right lateral flexion 100%  ?Left lateral flexion 100%  ?Right rotation 100%  ?Left rotation 100%  ? (Blank rows = not tested) ? ?LE ROM: ? ?Active  Right ?08/20/2021 Left ?08/20/2021  ?Hip flexion 120 120  ?Hip extension 30 30  ?Hip abduction 45 45  ?Hip adduction 30 30  ?Hip internal rotation 45 45  ?Hip external rotation 45 45  ?Knee flexion 135 135  ?Knee extension 0 0  ?Ankle dorsiflexion 20 20  ?Ankle plantarflexion 50 50  ?Ankle inversion    ?Ankle eversion    ? (Blank rows = not tested) ? ?LE MMT: ? ?MMT Right ?08/20/2021 Left ?08/20/2021  ?Hip flexion 5 5  ?Hip extension 4 4  ?Hip abduction 4 4  ?Hip adduction 4 4  ?Hip internal rotation 5 5  ?Hip external rotation 5 4  ?Knee flexion 5 5  ?Knee extension 5 5  ?Ankle dorsiflexion 5 5  ?Ankle plantarflexion    ?Ankle inversion    ?Ankle eversion    ? (Blank rows = not tested) ? ? ? ? ?LUMBAR SPECIAL TESTS:  ?Straight leg raise test: Negative and Slump test: Negative ? ?TODAY'S TREATMENT   ?08/20/21  ? Sidelying Hip Abduction 1 x 10  ? Sidelying Hip Adduction 1 x 10  ? Supine Bridges 1 x 10  ? ? ? ?PATIENT EDUCATION:  ?Education details: form and technique for appropriate exercise. Explanati

## 2021-08-22 ENCOUNTER — Encounter: Payer: Self-pay | Admitting: Physical Therapy

## 2021-08-22 ENCOUNTER — Ambulatory Visit: Payer: Medicare Other | Admitting: Physical Therapy

## 2021-08-22 DIAGNOSIS — M6281 Muscle weakness (generalized): Secondary | ICD-10-CM

## 2021-08-22 DIAGNOSIS — M546 Pain in thoracic spine: Secondary | ICD-10-CM | POA: Diagnosis not present

## 2021-08-22 DIAGNOSIS — G8929 Other chronic pain: Secondary | ICD-10-CM

## 2021-08-22 NOTE — Therapy (Signed)
?OUTPATIENT PHYSICAL THERAPY TREATMENT NOTE ? ? ?Patient Name: Tiffany Costa ?MRN: [id number] ?DOB:1955-05-26, 66 y.o., female ?Today's Date: 08/22/2021 ? ?PCP: Gladstone Lighter, MD ?REFERRING PROVIDER: Diamond Nickel, DO ? ?END OF SESSION:  ? PT End of Session - 08/22/21 1250   ? ? Visit Number 2   ? Number of Visits 16   ? Date for PT Re-Evaluation 10/15/21   ? Authorization Type Medicare 2023   ? PT Start Time 1020   ? PT Stop Time 1100   ? PT Time Calculation (min) 40 min   ? Activity Tolerance Patient tolerated treatment well   ? Behavior During Therapy [facility] for tasks assessed/performed   ? ?  ?  ? ?  ? ? ?Past Medical History:  ?Diagnosis Date  ? 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 Blood transfusion without reported diagnosis   ? Cancer [facility])   ? breast cancer  ? Heart murmur   ? Hyperfunctioning follicular adenoma of thyroid gland   ? Hyperlipidemia   ? Migraines   ? Personal history of radiation therapy 2010  ? Urine incontinence   ? ?Past Surgical History:  ?Procedure Laterality Date  ? ABDOMINAL HYSTERECTOMY    ? BREAST LUMPECTOMY Right 2010  ? LYMPHADENECTOMY    ? WRIST FRACTURE SURGERY  2017  ? ?Patient Active Problem List  ? Diagnosis Date Noted  ? Acute non-recurrent maxillary sinusitis 01/28/2018  ? 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 Anxiety 02/18/2011  ? 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 Breast cancer, stage 1 ([place]) 02/10/2011  ? ? ?REFERRING DIAG: Left sided thoracic back pain  ? ?THERAPY DIAG:  ?Chronic left-sided thoracic back pain ? ?Muscle weakness (generalized) ? ?PERTINENT HISTORY: [place] 06/25/21  ?  ?Tiffany Costa is a 66 y.o. female that presents to clinic today for evaluation and management of back pain at the referral of Tiffany Costa, Tressia Miners, MD. She has previously been established with our group but this is a new problem.  ?  ?At the time of this visit, I reviewed her most recent evaluation by her PCP from 05/13/2021 for chronic low back and flank pain.  She had previously been treated for a UTI in October 2022. She is known to have scoliosis. At that visit, she was recommended CT abdomen/pelvis with contrast which showed some diverticulosis and thoracolumbar scoliosis. She was called about these results and so she was recommended to have x-rays of the spine. She was also put on gabapentin. She had entire spine x-rays on 06/06/2021 which showed sigmoid thoracolumbar scoliosis with multiple level degenerative disc and facet changes. She was referred to orthopedics for this issue. Her most recent labs from 01/16/2021 show creatinine 0.8, normal electrolytes, normal TSH, normal vitamin D. She did have a urinalysis on 02/13/2021 that was slightly cloudy with positive nitrites, trace leukocyte Estrace, many bacteria, but no red blood cells/ketones/glucose. ?  ?Her pain began suddenly approximately 8 months ago with no acute trauma or injury. She denies any previous accident, injury, trauma, fall, strain to this area. She does note a remote fall where she hit her face and wonders if this could have exacerbated her pains. The pain is located over the left flank and radiates intermittently to the left abdomen. She denies any radiation of symptoms into her legs She describes her pain as intermittent, worsening, sharp, dull, throbbing, aching, nagging, intense, stabbing, clenching, and feeling like a "spasm". It is aggravated by laying flat on her back at night and is worse  at night. She currently rates pain severity as a 6/10. She reports associated pain at night. She denies associated swelling, bruising, skin color change, locking, catching, clicking, instability, limping, difficulty walking, numbness/tingling, weakness, fever/chills, nausea, night sweats, weight loss. She has tried Tylenol and Celecoxib. She also notes that using a more firm mattress has helped at times. She took gabapentin for 1 week but felt that it made her too drowsy in the morning so she stopped it.   ? ?PRECAUTIONS: None  ? ?SUBJECTIVE: Pt reports feeling some discomfort last night and needing to lay on left side to avoid pain on her right side. She also continues to experience increased pain in left foot from bunion.  ? ?PAIN:  ?Are you having pain? No ? ?OBJECTIVE:  ?  ?            VITALS: BP 162/76 HR 63 SpO2 100   ?             ?DIAGNOSTIC FINDINGS:  ?EXAM: ?Lumbar Spine Radiographs - 2 views (AP, Lateral) performed 06/06/2021 ?  ?FINDINGS:  ?There are 5 lumbar type vertebrae without evidence of compression injury. Normal lumbar lordosis. Moderate, sigmoid thoracolumbar scoliosis scoliosis. No listhesis. Multilevel degenerative disc and facet changes.  ?  ?  ?PATIENT SURVEYS:  ?FOTO 60/64 ?  ?SCREENING FOR RED FLAGS: ?Bowel or bladder incontinence: No ?Spinal tumors: No ?Cauda equina syndrome: No ?Compression fracture: No ?Abdominal aneurysm: No ?  ?COGNITION: ?          Overall cognitive status: Within functional limits for tasks assessed               ?           ?SENSATION: ?Describes that left sided thoracic area around T10 and T11 feels itchy intermittently  ?  ?MUSCLE LENGTH: ?Hamstrings: Right 90 deg; Left 90 deg ?Thomas test: positive bilateral  ?  ?POSTURE:  ?Forward flexed rounded shoulders with slight lateral lean to left  ?  ?PALPATION: ?10th and 11th Ribs on left side along with T10th and T11th spinous vertebrae  ?  ?LUMBAR ROM:  ?  ?Active  A/PROM  ?08/20/2021  ?Flexion 100%  ?Extension 100%  ?Right lateral flexion 100%  ?Left lateral flexion 100%  ?Right rotation 100%  ?Left rotation 100%  ? (Blank rows = not tested) ?  ?LE ROM: ?  ?Active  Right ?08/20/2021 Left ?08/20/2021  ?Hip flexion 120 120  ?Hip extension 30 30  ?Hip abduction 45 45  ?Hip adduction 30 30  ?Hip internal rotation 45 45  ?Hip external rotation 45 45  ?Knee flexion 135 135  ?Knee extension 0 0  ?Ankle dorsiflexion 20 20  ?Ankle plantarflexion 50 50  ?Ankle inversion      ?Ankle eversion      ? (Blank rows = not tested) ?  ?LE  MMT: ?  ?MMT Right ?08/20/2021 Left ?08/20/2021  ?Hip flexion 5 5  ?Hip extension 4 4  ?Hip abduction 4 4  ?Hip adduction 4 4  ?Hip internal rotation 5 5  ?Hip external rotation 5 4  ?Knee flexion 5 5  ?Knee extension 5 5  ?Ankle dorsiflexion 5 5  ?Ankle plantarflexion      ?Ankle inversion      ?Ankle eversion      ? (Blank rows = not tested) ?  ? ?  ?  ?LUMBAR SPECIAL TESTS:  ?Straight leg raise test: Negative and Slump test: Negative ?  ?TODAY'S TREATMENT  ?08/22/21 ?Nu-Step  seat level 4 for 5 min  ? Seated trunk stretch 3 x 30 sec  ? Triangle Pose 3 x 30 sec   ? Standing wall lat stretch 5 x 30 sec  ? -Pt feels continue discomfort in LUE  ? Supine Lat Stretch with PT overpressure 5 x 30 sec  ? ?08/20/21  ? Sidelying Hip Abduction 1 x 10  ? Sidelying Hip Adduction 1 x 10  ? Supine Bridges 1 x 10  ?  ?  ?  ?PATIENT EDUCATION:  ?Education details: form and technique for appropriate exercise. Explanation about benefits of PT to improve back pain.  ?Person educated: Patient ?Education method: Explanation, Demonstration, Verbal cues, and Handouts ?Education comprehension: verbalized understanding, verbal cues required, tactile cues required, and needs further education ?  ?  ?HOME EXERCISE PROGRAM: ?Access Code: [email or web address] ?URL: [email or web address] ?Date: 08/20/2021 ?Prepared by: Bradly Chris ?  ?Exercises ?- Sidelying Hip Abduction  - 1 x daily - 3 x weekly - 3 sets - 10 reps ?- Supine Bridge  - 1 x daily - 3 x weekly - 3 sets - 10 reps ?- Sidelying Hip Adduction  - 1 x daily - 3 x weekly - 3 sets - 10 reps ?  ?ASSESSMENT: ?  ?CLINICAL IMPRESSION: ? ?Pt exhibits significant left lateral lean and improvement of symptoms with stretching of left side of body and relief of left flank pain. Strengthening of left trunk musculature and hip muscles deferred to next visit. She will continue to benefit from ongoing stretching of convex scoliosis and strengthening of shortened musculature.    ? ? ?  ?  ?OBJECTIVE  IMPAIRMENTS decreased strength, postural dysfunction, and pain.  ?  ?ACTIVITY LIMITATIONS laundry, shopping, and yard work.  ?  ?PERSONAL FACTORS 1 comorbidity: Scoliosis   are also affecting patient's functi

## 2021-08-27 ENCOUNTER — Telehealth: Payer: Self-pay | Admitting: Physical Therapy

## 2021-08-27 ENCOUNTER — Ambulatory Visit: Payer: Medicare Other | Admitting: Physical Therapy

## 2021-08-27 NOTE — Telephone Encounter (Signed)
Called pt to inquire about absence. She did not respond so VM left instructing pt to call back to check in.  ?

## 2021-08-29 ENCOUNTER — Encounter: Payer: Medicare Other | Admitting: Physical Therapy

## 2021-09-02 ENCOUNTER — Encounter: Payer: Self-pay | Admitting: Physical Therapy

## 2021-09-02 ENCOUNTER — Ambulatory Visit: Payer: Medicare Other | Attending: Sports Medicine | Admitting: Physical Therapy

## 2021-09-02 DIAGNOSIS — G8929 Other chronic pain: Secondary | ICD-10-CM | POA: Diagnosis present

## 2021-09-02 DIAGNOSIS — M546 Pain in thoracic spine: Secondary | ICD-10-CM | POA: Insufficient documentation

## 2021-09-02 DIAGNOSIS — M6281 Muscle weakness (generalized): Secondary | ICD-10-CM | POA: Diagnosis present

## 2021-09-02 NOTE — Therapy (Signed)
?OUTPATIENT PHYSICAL THERAPY TREATMENT NOTE ? ? ?Patient Name: Tiffany Costa ?MRN: [id number] ?DOB:12-23-1955, 66 y.o., female ?Today's Date: 09/02/2021 ? ?PCP: Tiffany Lighter, MD ?REFERRING PROVIDER: Diamond Nickel, DO ? ?END OF SESSION:  ? PT End of Session - 09/02/21 1420   ? ? Visit Number 3   ? Number of Visits 16   ? Date for PT Re-Evaluation 10/15/21   ? Authorization Type Medicare 2023   ? PT Start Time 1415   ? PT Stop Time 1500   ? PT Time Calculation (min) 45 min   ? Activity Tolerance Patient tolerated treatment well   ? Behavior During Therapy [facility] for tasks assessed/performed   ? ?  ?  ? ?  ? ? ?Past Medical History:  ?Diagnosis Date  ? 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 Blood transfusion without reported diagnosis   ? Cancer [facility])   ? breast cancer  ? Heart murmur   ? Hyperfunctioning follicular adenoma of thyroid gland   ? Hyperlipidemia   ? Migraines   ? Personal history of radiation therapy 2010  ? Urine incontinence   ? ?Past Surgical History:  ?Procedure Laterality Date  ? ABDOMINAL HYSTERECTOMY    ? BREAST LUMPECTOMY Right 2010  ? LYMPHADENECTOMY    ? WRIST FRACTURE SURGERY  2017  ? ?Patient Active Problem List  ? Diagnosis Date Noted  ? Acute non-recurrent maxillary sinusitis 01/28/2018  ? 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 Anxiety 02/18/2011  ? 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 Breast cancer, stage 1 ([place]) 02/10/2011  ? ? ?REFERRING DIAG: Left sided thoracic back pain  ? ?THERAPY DIAG:  ?Chronic left-sided thoracic back pain ? ?Muscle weakness (generalized) ? ?PERTINENT HISTORY: Tiffany Costa 06/25/21  ?  ?Tiffany Costa is a 66 y.o. female that presents to clinic today for evaluation and management of back pain at the referral of Tiffany Costa, Tiffany Miners, MD. She has previously been established with our group but this is a new problem.  ?  ?At the time of this visit, I reviewed her most recent evaluation by her PCP from 05/13/2021 for chronic low back and flank pain.  She had previously been treated for a UTI in October 2022. She is known to have scoliosis. At that visit, she was recommended CT abdomen/pelvis with contrast which showed some diverticulosis and thoracolumbar scoliosis. She was called about these results and so she was recommended to have x-rays of the spine. She was also put on gabapentin. She had entire spine x-rays on 06/06/2021 which showed sigmoid thoracolumbar scoliosis with multiple level degenerative disc and facet changes. She was referred to orthopedics for this issue. Her most recent labs from 01/16/2021 show creatinine 0.8, normal electrolytes, normal TSH, normal vitamin D. She did have a urinalysis on 02/13/2021 that was slightly cloudy with positive nitrites, trace leukocyte Estrace, many bacteria, but no red blood cells/ketones/glucose. ?  ?Her pain began suddenly approximately 8 months ago with no acute trauma or injury. She denies any previous accident, injury, trauma, fall, strain to this area. She does note a remote fall where she hit her face and wonders if this could have exacerbated her pains. The pain is located over the left flank and radiates intermittently to the left abdomen. She denies any radiation of symptoms into her legs She describes her pain as intermittent, worsening, sharp, dull, throbbing, aching, nagging, intense, stabbing, clenching, and feeling like a "spasm". It is aggravated by laying flat on her back at night and is worse  at night. She currently rates pain severity as a 6/10. She reports associated pain at night. She denies associated swelling, bruising, skin color change, locking, catching, clicking, instability, limping, difficulty walking, numbness/tingling, weakness, fever/chills, nausea, night sweats, weight loss. She has tried Tylenol and Celecoxib. She also notes that using a more firm mattress has helped at times. She took gabapentin for 1 week but felt that it made her too drowsy in the morning so she stopped it.   ? ?PRECAUTIONS: None  ? ?SUBJECTIVE: Pt reports feeling some discomfort last night and needing to lay on left side to avoid pain on her right side. She also continues to experience increased pain in left foot from bunion.  ? ?PAIN:  ?Are you having pain? No ? ?OBJECTIVE:  ?  ?            VITALS: BP 162/76 HR 63 SpO2 100   ?             ?DIAGNOSTIC FINDINGS:  ?EXAM: ?Lumbar Spine Radiographs - 2 views (AP, Lateral) performed 06/06/2021 ?  ?FINDINGS:  ?There are 5 lumbar type vertebrae without evidence of compression injury. Normal lumbar lordosis. Moderate, sigmoid thoracolumbar scoliosis scoliosis. No listhesis. Multilevel degenerative disc and facet changes.  ?  ?  ?PATIENT SURVEYS:  ?FOTO 60/64 ?  ?SCREENING FOR RED FLAGS: ?Bowel or bladder incontinence: No ?Spinal tumors: No ?Cauda equina syndrome: No ?Compression fracture: No ?Abdominal aneurysm: No ?  ?COGNITION: ?          Overall cognitive status: Within functional limits for tasks assessed               ?           ?SENSATION: ?Describes that left sided thoracic area around T10 and T11 feels itchy intermittently  ?  ?MUSCLE LENGTH: ?Hamstrings: Right 90 deg; Left 90 deg ?Thomas test: positive bilateral  ?  ?POSTURE:  ?Forward flexed rounded shoulders with slight lateral lean to left  ?  ?PALPATION: ?10th and 11th Ribs on left side along with T10th and T11th spinous vertebrae  ?  ?LUMBAR ROM:  ?  ?Active  A/PROM  ?08/20/2021  ?Flexion 100%  ?Extension 100%  ?Right lateral flexion 100%  ?Left lateral flexion 100%  ?Right rotation 100%  ?Left rotation 100%  ? (Blank rows = not tested) ?  ?LE ROM: ?  ?Active  Right ?08/20/2021 Left ?08/20/2021  ?Hip flexion 120 120  ?Hip extension 30 30  ?Hip abduction 45 45  ?Hip adduction 30 30  ?Hip internal rotation 45 45  ?Hip external rotation 45 45  ?Knee flexion 135 135  ?Knee extension 0 0  ?Ankle dorsiflexion 20 20  ?Ankle plantarflexion 50 50  ?Ankle inversion      ?Ankle eversion      ? (Blank rows = not tested) ?  ?LE  MMT: ?  ?MMT Right ?08/20/2021 Left ?08/20/2021  ?Hip flexion 5 5  ?Hip extension 4 4  ?Hip abduction 4 4  ?Hip adduction 4 4  ?Hip internal rotation 5 5  ?Hip external rotation 5 4  ?Knee flexion 5 5  ?Knee extension 5 5  ?Ankle dorsiflexion 5 5  ?Ankle plantarflexion      ?Ankle inversion      ?Ankle eversion      ? (Blank rows = not tested) ?  ? ?  ?  ?LUMBAR SPECIAL TESTS:  ?Straight leg raise test: Negative and Slump test: Negative ?  ?TODAY'S TREATMENT  ?09/02/21 ?TM  1.3 mph  ?Quadruped Hip Ext 3 x 10  ? ?Side Step Downs from 17f Step 3 x 10  ?-with intermittent use of UE  ? ?Hip Abduction Stretch with PT overpressure 2 x 30 sec  ?HS Stretch with PT overpressure 2 x 30 sec  ?Hip Adductor 4 way stretch with PT overpressure 2 x 30 sec  ? ?Sidelying TFL Stretch 2 x 30 sec  ?Standing Lat Stretch 2 x 30 sec    ? ?08/22/21 ?Nu-Step seat level 4 for 5 min  ? Seated trunk stretch 3 x 30 sec  ? Triangle Pose 3 x 30 sec   ? Standing wall lat stretch 5 x 30 sec  ? -Pt feels continue discomfort in LUE  ? Supine Lat Stretch with PT overpressure 5 x 30 sec  ? ?08/20/21  ? Sidelying Hip Abduction 1 x 10  ? Sidelying Hip Adduction 1 x 10  ? Supine Bridges 1 x 10  ?  ?  ?  ?PATIENT EDUCATION:  ?Education details: form and technique for appropriate exercise. Explanation about benefits of PT to improve back pain.  ?Person educated: Patient ?Education method: Explanation, Demonstration, Verbal cues, and Handouts ?Education comprehension: verbalized understanding, verbal cues required, tactile cues required, and needs further education ?  ?  ?HOME EXERCISE PROGRAM: ?Access Code: [email or web address]?URL: [email or web address] ?Date: 09/02/2021 ?Prepared by: DBradly Chris? ?Exercises ?- Side Step Down with Counter Support  - 1 x daily - 3 x weekly - 3 sets - 10 reps ?- Supine Bridge  - 1 x daily - 3 x weekly - 3 sets - 10 reps ?- Sidelying Hip Adduction  - 1 x daily - 3 x weekly - 3 sets - 10 reps ?- Triangle Pose  - 1 x daily - 7 x  weekly - 1 sets - 3 reps - 30 hold ?- Latissimus Dorsi Stretch at Wall  - 1 x daily - 7 x weekly - 1 sets - 3 reps - 30 hold ?  ?ASSESSMENT: ?  ?CLINICAL IMPRESSION: ? ?Pt's continues to demonstrate LE musc

## 2021-09-03 ENCOUNTER — Encounter: Payer: Medicare Other | Admitting: Physical Therapy

## 2021-09-05 ENCOUNTER — Encounter: Payer: Self-pay | Admitting: Physical Therapy

## 2021-09-05 ENCOUNTER — Ambulatory Visit: Payer: Medicare Other

## 2021-09-05 DIAGNOSIS — M6281 Muscle weakness (generalized): Secondary | ICD-10-CM

## 2021-09-05 DIAGNOSIS — M546 Pain in thoracic spine: Secondary | ICD-10-CM | POA: Diagnosis not present

## 2021-09-05 DIAGNOSIS — G8929 Other chronic pain: Secondary | ICD-10-CM

## 2021-09-05 NOTE — Therapy (Signed)
?OUTPATIENT PHYSICAL THERAPY TREATMENT NOTE ? ? ?Patient Name: Tiffany Costa ?MRN: [id number] ?DOB:05/27/1955, 66 y.o., female ?Today's Date: 09/05/2021 ? ?PCP: Gladstone Lighter, MD ?REFERRING PROVIDER: Diamond Nickel, DO ? ?END OF SESSION:  ? PT End of Session - 09/05/21 1023   ? ? Visit Number 4   ? Number of Visits 16   ? Date for PT Re-Evaluation 10/15/21   ? Authorization Type Medicare 2023   ? PT Start Time 1020   ? PT Stop Time 1100   ? PT Time Calculation (min) 40 min   ? Activity Tolerance Patient tolerated treatment well   ? Behavior During Therapy [facility] for tasks assessed/performed   ? ?  ?  ? ?  ? ? ? ?Past Medical History:  ?Diagnosis Date  ? 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 Blood transfusion without reported diagnosis   ? Cancer [facility])   ? breast cancer  ? Heart murmur   ? Hyperfunctioning follicular adenoma of thyroid gland   ? Hyperlipidemia   ? Migraines   ? Personal history of radiation therapy 2010  ? Urine incontinence   ? ?Past Surgical History:  ?Procedure Laterality Date  ? ABDOMINAL HYSTERECTOMY    ? BREAST LUMPECTOMY Right 2010  ? LYMPHADENECTOMY    ? WRIST FRACTURE SURGERY  2017  ? ?Patient Active Problem List  ? Diagnosis Date Noted  ? Acute non-recurrent maxillary sinusitis 01/28/2018  ? 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 Anxiety 02/18/2011  ? 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 Breast cancer, stage 1 ([place]) 02/10/2011  ? ? ?REFERRING DIAG: Left sided thoracic back pain  ? ?THERAPY DIAG:  ?Chronic left-sided thoracic back pain ? ?Muscle weakness (generalized) ? ?PERTINENT HISTORY: Kubinski 06/25/21  ?  ?Tiffany Costa is a 66 y.o. female that presents to clinic today for evaluation and management of back pain at the referral of Radhika, Tressia Miners, MD. She has previously been established with our group but this is a new problem.  ?  ?At the time of this visit, I reviewed her most recent evaluation by her PCP from 05/13/2021 for chronic low back and flank pain.  She had previously been treated for a UTI in October 2022. She is known to have scoliosis. At that visit, she was recommended CT abdomen/pelvis with contrast which showed some diverticulosis and thoracolumbar scoliosis. She was called about these results and so she was recommended to have x-rays of the spine. She was also put on gabapentin. She had entire spine x-rays on 06/06/2021 which showed sigmoid thoracolumbar scoliosis with multiple level degenerative disc and facet changes. She was referred to orthopedics for this issue. Her most recent labs from 01/16/2021 show creatinine 0.8, normal electrolytes, normal TSH, normal vitamin D. She did have a urinalysis on 02/13/2021 that was slightly cloudy with positive nitrites, trace leukocyte Estrace, many bacteria, but no red blood cells/ketones/glucose. ?  ?Her pain began suddenly approximately 8 months ago with no acute trauma or injury. She denies any previous accident, injury, trauma, fall, strain to this area. She does note a remote fall where she hit her face and wonders if this could have exacerbated her pains. The pain is located over the left flank and radiates intermittently to the left abdomen. She denies any radiation of symptoms into her legs She describes her pain as intermittent, worsening, sharp, dull, throbbing, aching, nagging, intense, stabbing, clenching, and feeling like a "spasm". It is aggravated by laying flat on her back at night and is  worse at night. She currently rates pain severity as a 6/10. She reports associated pain at night. She denies associated swelling, bruising, skin color change, locking, catching, clicking, instability, limping, difficulty walking, numbness/tingling, weakness, fever/chills, nausea, night sweats, weight loss. She has tried Tylenol and Celecoxib. She also notes that using a more firm mattress has helped at times. She took gabapentin for 1 week but felt that it made her too drowsy in the morning so she stopped it.   ? ?PRECAUTIONS: None  ? ?SUBJECTIVE: Pt reports some soreness from last session, took gabapentin last night so no pain currently. Describes difficulty completing HEP due to being busy at home. ? ?PAIN:  ?Are you having pain? No ? ?OBJECTIVE:  ?  ?            VITALS: BP 162/76 HR 63 SpO2 100   ?             ?DIAGNOSTIC FINDINGS:  ?EXAM: ?Lumbar Spine Radiographs - 2 views (AP, Lateral) performed 06/06/2021 ?  ?FINDINGS:  ?There are 5 lumbar type vertebrae without evidence of compression injury. Normal lumbar lordosis. Moderate, sigmoid thoracolumbar scoliosis scoliosis. No listhesis. Multilevel degenerative disc and facet changes.  ?  ?  ?PATIENT SURVEYS:  ?FOTO 60/64 ?  ?SCREENING FOR RED FLAGS: ?Bowel or bladder incontinence: No ?Spinal tumors: No ?Cauda equina syndrome: No ?Compression fracture: No ?Abdominal aneurysm: No ?  ?COGNITION: ?          Overall cognitive status: Within functional limits for tasks assessed               ?           ?SENSATION: ?Describes that left sided thoracic area around T10 and T11 feels itchy intermittently  ?  ?MUSCLE LENGTH: ?Hamstrings: Right 90 deg; Left 90 deg ?Thomas test: positive bilateral  ?  ?POSTURE:  ?Forward flexed rounded shoulders with slight lateral lean to left  ?  ?PALPATION: ?10th and 11th Ribs on left side along with T10th and T11th spinous vertebrae  ?  ?LUMBAR ROM:  ?  ?Active  A/PROM  ?08/20/2021  ?Flexion 100%  ?Extension 100%  ?Right lateral flexion 100%  ?Left lateral flexion 100%  ?Right rotation 100%  ?Left rotation 100%  ? (Blank rows = not tested) ?  ?LE ROM: ?  ?Active  Right ?08/20/2021 Left ?08/20/2021  ?Hip flexion 120 120  ?Hip extension 30 30  ?Hip abduction 45 45  ?Hip adduction 30 30  ?Hip internal rotation 45 45  ?Hip external rotation 45 45  ?Knee flexion 135 135  ?Knee extension 0 0  ?Ankle dorsiflexion 20 20  ?Ankle plantarflexion 50 50  ?Ankle inversion      ?Ankle eversion      ? (Blank rows = not tested) ?  ?LE MMT: ?  ?MMT Right ?08/20/2021  Left ?08/20/2021  ?Hip flexion 5 5  ?Hip extension 4 4  ?Hip abduction 4 4  ?Hip adduction 4 4  ?Hip internal rotation 5 5  ?Hip external rotation 5 4  ?Knee flexion 5 5  ?Knee extension 5 5  ?Ankle dorsiflexion 5 5  ?Ankle plantarflexion      ?Ankle inversion      ?Ankle eversion      ? (Blank rows = not tested) ?  ? ?  ?  ?LUMBAR SPECIAL TESTS:  ?Straight leg raise test: Negative and Slump test: Negative ?  ?TODAY'S TREATMENT  ?09/05/21 ? ?There.ex:  ? Treadmill 1.3 MPH for 5  minutes. ? ? Standing L sided lat stretch in door frame. Min Multimodal cues throughout for attempting squared shoulders in frontal plane. 4x30 sec. Fair carryover after cues ? ? Alternating eccentric step downs from 4" step: 2x10/LE. VC's for form/technique. SUE support needed, VC's for eccentric control on LLE due to increased difficulty.  ?  ? With 10# DB in L hand, R lateral trunk lean; and R hand, L lateral trunk lean:  2x10. Use of mirror as visual cue for L side. L side > R side with difficulty with R truncal rotation and L innominate rotation. Easily improved with TC's at pelvis and use of mirror as visual cuing.   ? ? RUE scap retractions at [facility]: standing on 4" step. 5#, x15 on R side. ?  ?  ?PATIENT EDUCATION:  ?Education details: form and technique for appropriate exercise. Explanation about benefits of PT to improve back pain.  ?Person educated: Patient ?Education method: Explanation, Demonstration, Verbal cues, and Handouts ?Education comprehension: verbalized understanding, verbal cues required, tactile cues required, and needs further education ?  ?  ?HOME EXERCISE PROGRAM: ?Access Code: [email or web address] ?URL: [email or web address] ?Date: 09/02/2021 ?Prepared by: Bradly Chris ? ?Exercises ?- Side Step Down with Counter Support  - 1 x daily - 3 x weekly - 3 sets - 10 reps ?- Supine Bridge  - 1 x daily - 3 x weekly - 3 sets - 10 reps ?- Sidelying Hip Adduction  - 1 x daily - 3 x weekly - 3 sets - 10 reps ?- Triangle Pose   - 1 x daily - 7 x weekly - 1 sets - 3 reps - 30 hold ?- Latissimus Dorsi Stretch at Wall  - 1 x daily - 7 x weekly - 1 sets - 3 reps - 30 hold ?  ?ASSESSMENT: ?  ?CLINICAL IMPRESSION: ?Continuing PT POC wi

## 2021-09-09 ENCOUNTER — Ambulatory Visit: Payer: Medicare Other | Admitting: Physical Therapy

## 2021-09-09 ENCOUNTER — Telehealth: Payer: Self-pay | Admitting: Physical Therapy

## 2021-09-09 NOTE — Telephone Encounter (Signed)
Called pt to inquiry about absence. Pt did not realize she had an apt today and she was aware of her next apt and would be there. ?

## 2021-09-12 ENCOUNTER — Ambulatory Visit: Payer: Medicare Other

## 2021-09-12 DIAGNOSIS — M6281 Muscle weakness (generalized): Secondary | ICD-10-CM

## 2021-09-12 DIAGNOSIS — M546 Pain in thoracic spine: Secondary | ICD-10-CM | POA: Diagnosis not present

## 2021-09-12 DIAGNOSIS — G8929 Other chronic pain: Secondary | ICD-10-CM

## 2021-09-12 NOTE — Therapy (Signed)
?OUTPATIENT PHYSICAL THERAPY TREATMENT NOTE ? ? ?Patient Name: Tiffany Costa ?MRN: [id number] ?DOB:1956-01-25, 66 y.o., female ?Today's Date: 09/12/2021 ? ?PCP: Gladstone Lighter, MD ?REFERRING PROVIDER: Diamond Nickel, DO ? ?END OF SESSION:  ? PT End of Session - 09/12/21 1600   ? ? Visit Number 5   ? Number of Visits 16   ? Date for PT Re-Evaluation 10/15/21   ? Authorization Type Medicare 2023   ? Authorization Time Period 08/20/21-10/15/21   ? Progress Note Due on Visit 10   ? PT Start Time 1552   ? PT Stop Time 1630   ? PT Time Calculation (min) 38 min   ? Activity Tolerance Patient tolerated treatment well   ? Behavior During Therapy [facility] for tasks assessed/performed   ? ?  ?  ? ?  ? ? ? ?Past Medical History:  ?Diagnosis Date  ? 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 Blood transfusion without reported diagnosis   ? Cancer [facility])   ? breast cancer  ? Heart murmur   ? Hyperfunctioning follicular adenoma of thyroid gland   ? Hyperlipidemia   ? Migraines   ? Personal history of radiation therapy 2010  ? Urine incontinence   ? ?Past Surgical History:  ?Procedure Laterality Date  ? ABDOMINAL HYSTERECTOMY    ? BREAST LUMPECTOMY Right 2010  ? LYMPHADENECTOMY    ? WRIST FRACTURE SURGERY  2017  ? ?Patient Active Problem List  ? Diagnosis Date Noted  ? Acute non-recurrent maxillary sinusitis 01/28/2018  ? 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 Anxiety 02/18/2011  ? 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 Breast cancer, stage 1 ([place]) 02/10/2011  ? ? ?REFERRING DIAG: Left sided thoracic back pain  ? ?THERAPY DIAG:  ?Chronic left-sided thoracic back pain ? ?Muscle weakness (generalized) ? ?PERTINENT HISTORY: Kubinski 06/25/21  ?  ?Tiffany Costa is a 66 y.o. female that presents to clinic today for evaluation and management of back pain at the referral of Radhika, Tressia Miners, MD. She has previously been established with our group but this is a new problem.  ?  ?At the time of this visit, I reviewed her most  recent evaluation by her PCP from 05/13/2021 for chronic low back and flank pain. She had previously been treated for a UTI in October 2022. She is known to have scoliosis. At that visit, she was recommended CT abdomen/pelvis with contrast which showed some diverticulosis and thoracolumbar scoliosis. She was called about these results and so she was recommended to have x-rays of the spine. She was also put on gabapentin. She had entire spine x-rays on 06/06/2021 which showed sigmoid thoracolumbar scoliosis with multiple level degenerative disc and facet changes. She was referred to orthopedics for this issue. Her most recent labs from 01/16/2021 show creatinine 0.8, normal electrolytes, normal TSH, normal vitamin D. She did have a urinalysis on 02/13/2021 that was slightly cloudy with positive nitrites, trace leukocyte Estrace, many bacteria, but no red blood cells/ketones/glucose. ?  ?Her pain began suddenly approximately 8 months ago with no acute trauma or injury. She denies any previous accident, injury, trauma, fall, strain to this area. She does note a remote fall where she hit her face and wonders if this could have exacerbated her pains. The pain is located over the left flank and radiates intermittently to the left abdomen. She denies any radiation of symptoms into her legs She describes her pain as intermittent, worsening, sharp, dull, throbbing, aching, nagging, intense, stabbing, clenching, and feeling  like a "spasm". It is aggravated by laying flat on her back at night and is worse at night. She currently rates pain severity as a 6/10. She reports associated pain at night. She denies associated swelling, bruising, skin color change, locking, catching, clicking, instability, limping, difficulty walking, numbness/tingling, weakness, fever/chills, nausea, night sweats, weight loss. She has tried Tylenol and Celecoxib. She also notes that using a more firm mattress has helped at times. She took gabapentin for 1  week but felt that it made her too drowsy in the morning so she stopped it.  ? ?PRECAUTIONS: None  ? ?SUBJECTIVE: Pt doing well in general, still having pain. Has been sick still so her HEP is on hiatus.  ? ?PAIN:  ?Are you having pain? Yes 5/10 ? ?OBJECTIVE:  ?  ?          ?             ?DIAGNOSTIC FINDINGS:  ?EXAM: ?Lumbar Spine Radiographs - 2 views (AP, Lateral) performed 06/06/2021 ?  ?FINDINGS:  ?There are 5 lumbar type vertebrae without evidence of compression injury. Normal lumbar lordosis. Moderate, sigmoid thoracolumbar scoliosis scoliosis. No listhesis. Multilevel degenerative disc and facet changes.  ?  ?  ?PATIENT SURVEYS:  ?FOTO 60 ?  ?Objective Tests and Measures.              ?           ? ? ?  ?LUMBAR ROM:  ?  ?Active  A/PROM  ?08/20/2021  ?Flexion 100%  ?Extension 100%  ?Right lateral flexion 100%  ?Left lateral flexion 100%  ?Right rotation 100%  ?Left rotation 100%  ? (Blank rows = not tested) ?  ?LE ROM: ?  ?Active  Right ?08/20/2021 Left ?08/20/2021  ?Hip flexion 120 120  ?Hip extension 30 30  ?Hip abduction 45 45  ?Hip adduction 30 30  ?Hip internal rotation 45 45  ?Hip external rotation 45 45  ?Knee flexion 135 135  ?Knee extension 0 0  ?Ankle dorsiflexion 20 20  ?Ankle plantarflexion 50 50  ?Ankle inversion      ?Ankle eversion      ? (Blank rows = not tested) ?  ?LE MMT: ?  ?MMT Right ?08/20/2021 Left ?08/20/2021  ?Hip flexion 5 5  ?Hip extension 4 4  ?Hip abduction 4 4  ?Hip adduction 4 4  ?Hip internal rotation 5 5  ?Hip external rotation 5 4  ?Knee flexion 5 5  ?Knee extension 5 5  ?Ankle dorsiflexion 5 5  ?Ankle plantarflexion      ?Ankle inversion      ?Ankle eversion      ? (Blank rows = not tested) ?  ? ?  ?  ?LUMBAR SPECIAL TESTS:  ?Straight leg raise test: Negative and Slump test: Negative ?  ?TODAY'S TREATMENT  ?09/12/21 ? ?-nustep x5 min, level 1, seat 4, arms 7 ?-Rt trunk rotation stretch 3x30sec  ?-Green TB  clam 15x3secH  ?-Gluteal Bridge 1x15  ?-seated rt trunk rotation 1x15 c green  tb ? ?PATIENT EDUCATION:  ?Education details: form and technique for appropriate exercise. Explanation about benefits of PT to improve back pain.  ?Person educated: Patient ?Education method: Explanation, Demonstration, Verbal cues, and Handouts ?Education comprehension: verbalized understanding, verbal cues required, tactile cues required, and needs further education ?  ?  ?HOME EXERCISE PROGRAM: ?Access Code: [email or web address] ?URL: [email or web address] ?Date: 09/02/2021 ?Prepared by: Bradly Chris ? ?Exercises ?- Side Step Down with Counter Support  - 1 x  daily - 3 x weekly - 3 sets - 10 reps ?- Supine Bridge  - 1 x daily - 3 x weekly - 3 sets - 10 reps ?- Sidelying Hip Adduction  - 1 x daily - 3 x weekly - 3 sets - 10 reps ?- Triangle Pose  - 1 x daily - 7 x weekly - 1 sets - 3 reps - 30 hold ?- Latissimus Dorsi Stretch at Wall  - 1 x daily - 7 x weekly - 1 sets - 3 reps - 30 hold ?  ?ASSESSMENT: ?  ?CLINICAL IMPRESSION: ?Continuing PT POC with focus on L thorax mobility and R sided strengthening. Pt tolerating progression of mobility and resistance exercise without exacerbation of symptoms. Pt will continue to benefit from skilled PT services to progress mobility and decreased back pain with ADL completion. ? ? ? ?  ?  ?OBJECTIVE IMPAIRMENTS decreased strength, postural dysfunction, and pain.  ?  ?ACTIVITY LIMITATIONS laundry, shopping, and yard work.  ?  ?PERSONAL FACTORS 1 comorbidity: Scoliosis   are also affecting patient's functional outcome.  ?  ?  ?REHAB POTENTIAL: Good ?  ?CLINICAL DECISION MAKING: Stable/uncomplicated ?  ?EVALUATION COMPLEXITY: Low ?  ?  ?GOALS: ?Goals reviewed with patient? No ?  ?SHORT TERM GOALS: Target date: 09/03/2021 ?  ?Pt will be independent with HEP in order to improve strength and balance in order to decrease fall risk and improve function at home and work. ?  ?Baseline: NT  ?Goal status: INITIAL ?  ?  ?  ?LONG TERM GOALS: Target date: 10/15/2021 ?  ?Patient will have  improved function and activity level as evidenced by an increase in FOTO score by 10 points or more.  ?Baseline: 60 ?Goal status: INITIAL ?  ?2.  Patient will improve hip strength by 1/2>= grade MMT to

## 2021-09-16 ENCOUNTER — Encounter: Payer: Self-pay | Admitting: Physical Therapy

## 2021-09-16 ENCOUNTER — Ambulatory Visit: Payer: Medicare Other | Admitting: Physical Therapy

## 2021-09-16 DIAGNOSIS — M6281 Muscle weakness (generalized): Secondary | ICD-10-CM

## 2021-09-16 DIAGNOSIS — G8929 Other chronic pain: Secondary | ICD-10-CM

## 2021-09-16 DIAGNOSIS — M546 Pain in thoracic spine: Secondary | ICD-10-CM | POA: Diagnosis not present

## 2021-09-16 NOTE — Therapy (Signed)
?OUTPATIENT PHYSICAL THERAPY TREATMENT NOTE ? ? ?Patient Name: Tiffany Costa ?MRN: [id number] ?DOB:1956/04/23, 66 y.o., female ?Today's Date: 09/16/2021 ? ?PCP: Tiffany Lighter, Costa ?REFERRING PROVIDER: Diamond Nickel, DO ? ?END OF SESSION:  ? PT End of Session - 09/16/21 1158   ? ? Visit Number 6   ? Number of Visits 16   ? Date for PT Re-Evaluation 10/15/21   ? Authorization Type Medicare 2023   ? Authorization Time Period 08/20/21-10/15/21   ? Progress Note Due on Visit 10   ? PT Start Time 1155   ? PT Stop Time 1230   ? PT Time Calculation (min) 35 min   ? Activity Tolerance Patient tolerated treatment well   ? Behavior During Therapy [facility] for tasks assessed/performed   ? ?  ?  ? ?  ? ? ? ?Past Medical History:  ?Diagnosis Date  ? 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 Blood transfusion without reported diagnosis   ? Cancer [facility])   ? breast cancer  ? Heart murmur   ? Hyperfunctioning follicular adenoma of thyroid gland   ? Hyperlipidemia   ? Migraines   ? Personal history of radiation therapy 2010  ? Urine incontinence   ? ?Past Surgical History:  ?Procedure Laterality Date  ? ABDOMINAL HYSTERECTOMY    ? BREAST LUMPECTOMY Right 2010  ? LYMPHADENECTOMY    ? WRIST FRACTURE SURGERY  2017  ? ?Patient Active Problem List  ? Diagnosis Date Noted  ? Acute non-recurrent maxillary sinusitis 01/28/2018  ? 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 Anxiety 02/18/2011  ? 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 Breast cancer, stage 1 ([place]) 02/10/2011  ? ? ?REFERRING DIAG: Left sided thoracic back pain  ? ?THERAPY DIAG:  ?Chronic left-sided thoracic back pain ? ?Muscle weakness (generalized) ? ?PERTINENT HISTORY: Tiffany Costa 06/25/21  ?  ?Tiffany Costa is a 66 y.o. female that presents to clinic today for evaluation and management of back pain at the referral of Tiffany Costa, Tiffany Miners, Costa. She has previously been established with our group but this is a new problem.  ?  ?At the time of this visit, I reviewed her most  recent evaluation by her PCP from 05/13/2021 for chronic low back and flank pain. She had previously been treated for a UTI in October 2022. She is known to have scoliosis. At that visit, she was recommended CT abdomen/pelvis with contrast which showed some diverticulosis and thoracolumbar scoliosis. She was called about these results and so she was recommended to have x-rays of the spine. She was also put on gabapentin. She had entire spine x-rays on 06/06/2021 which showed sigmoid thoracolumbar scoliosis with multiple level degenerative disc and facet changes. She was referred to orthopedics for this issue. Her most recent labs from 01/16/2021 show creatinine 0.8, normal electrolytes, normal TSH, normal vitamin D. She did have a urinalysis on 02/13/2021 that was slightly cloudy with positive nitrites, trace leukocyte Estrace, many bacteria, but no red blood cells/ketones/glucose. ?  ?Her pain began suddenly approximately 8 months ago with no acute trauma or injury. She denies any previous accident, injury, trauma, fall, strain to this area. She does note a remote fall where she hit her face and wonders if this could have exacerbated her pains. The pain is located over the left flank and radiates intermittently to the left abdomen. She denies any radiation of symptoms into her legs She describes her pain as intermittent, worsening, sharp, dull, throbbing, aching, nagging, intense, stabbing, clenching, and feeling  like a "spasm". It is aggravated by laying flat on her back at night and is worse at night. She currently rates pain severity as a 6/10. She reports associated pain at night. She denies associated swelling, bruising, skin color change, locking, catching, clicking, instability, limping, difficulty walking, numbness/tingling, weakness, fever/chills, nausea, night sweats, weight loss. She has tried Tylenol and Celecoxib. She also notes that using a more firm mattress has helped at times. She took gabapentin for 1  week but felt that it made her too drowsy in the morning so she stopped it.  ? ?PRECAUTIONS: None  ? ?SUBJECTIVE: Pt reports that she notices a decrease in her left hip pain since the start of physical therapy. However, she did have pain throughout lower back since Thursday.  ? ?PAIN:  ?Are you having pain? Yes 1/10 ? ?OBJECTIVE:  ?  ?          ?             ?DIAGNOSTIC FINDINGS:  ?EXAM: ?Lumbar Spine Radiographs - 2 views (AP, Lateral) performed 06/06/2021 ?  ?FINDINGS:  ?There are 5 lumbar type vertebrae without evidence of compression injury. Normal lumbar lordosis. Moderate, sigmoid thoracolumbar scoliosis scoliosis. No listhesis. Multilevel degenerative disc and facet changes.  ?  ?  ?PATIENT SURVEYS:  ?FOTO 60 ?  ?Objective Tests and Measures.              ?           ? ? ?  ?LUMBAR ROM:  ?  ?Active  A/PROM  ?08/20/2021  ?Flexion 100%  ?Extension 100%  ?Right lateral flexion 100%  ?Left lateral flexion 100%  ?Right rotation 100%  ?Left rotation 100%  ? (Blank rows = not tested) ?  ?LE ROM: ?  ?Active  Right ?08/20/2021 Left ?08/20/2021  ?Hip flexion 120 120  ?Hip extension 30 30  ?Hip abduction 45 45  ?Hip adduction 30 30  ?Hip internal rotation 45 45  ?Hip external rotation 45 45  ?Knee flexion 135 135  ?Knee extension 0 0  ?Ankle dorsiflexion 20 20  ?Ankle plantarflexion 50 50  ?Ankle inversion      ?Ankle eversion      ? (Blank rows = not tested) ?  ?LE MMT: ?  ?MMT Right ?08/20/2021 Left ?08/20/2021  ?Hip flexion 5 5  ?Hip extension 4 4  ?Hip abduction 4 4  ?Hip adduction 4 4  ?Hip internal rotation 5 5  ?Hip external rotation 5 4  ?Knee flexion 5 5  ?Knee extension 5 5  ?Ankle dorsiflexion 5 5  ?Ankle plantarflexion      ?Ankle inversion      ?Ankle eversion      ? (Blank rows = not tested) ?  ? ?  ?  ?LUMBAR SPECIAL TESTS:  ?Straight leg raise test: Negative and Slump test: Negative ?  ?TODAY'S TREATMENT  ?09/16/21  ? Nu step 5 min level seat 5 and arms 5  ? Hip Adduction Isometric 3 sec hold 1 x 10  ? Supine  Bridges with hip adductor squeeze 3 x 10 ? Sidelying Hip Abduction 1 x 10  ? Sidelying Hip Abduction with Yellow TB  2 x 10  ? ? ?09/12/21 ? ?-nustep x5 min, level 1, seat 4, arms 7 ?-Rt trunk rotation stretch 3x30sec  ?-Green TB  clam 15x3secH  ?-Gluteal Bridge 1x15  ?-seated rt trunk rotation 1x15 c green tb ? ?PATIENT EDUCATION:  ?Education details: form and technique for appropriate  exercise. Explanation about benefits of PT to improve back pain.  ?Person educated: Patient ?Education method: Explanation, Demonstration, Verbal cues, and Handouts ?Education comprehension: verbalized understanding, verbal cues required, tactile cues required, and needs further education ?  ?  ?HOME EXERCISE PROGRAM: ?Access Code: [email or web address] ?URL: [email or web address] ?Date: 09/16/2021 ?Prepared by: Bradly Chris ? ?Exercises ?- Side Step Down with Counter Support  - 1 x daily - 3 x weekly - 3 sets - 10 reps ?- Supine Bridge with Mini Swiss Ball Between Knees  - 1 x daily - 3 x weekly - 3 sets - 10 reps ?- Supine Bridge  - 1 x daily - 3 x weekly - 3 sets - 10 reps ?- Seated Isometric Hip Adduction with Ball  - 1 x daily - 3 x weekly - 3 sets - 10 reps - 3 hold ?- Triangle Pose  - 1 x daily - 7 x weekly - 1 sets - 3 reps - 30 hold ?- Latissimus Dorsi Stretch at Wall  - 1 x daily - 7 x weekly - 1 sets - 3 reps - 30 hold ?- Sidelying Hip Abduction  - 1 x daily - 3 x weekly - 3 sets - 10 reps ?  ?ASSESSMENT: ?  ?CLINICAL IMPRESSION: ?Pt demonstrates tolerance to all exercises without an increase in her left sided low back with the exception of the bridges with hip adduction. Hip abduction strength improved with need for progression with addition of yellow TB for increased resistance. She will continue to benefit from skilled PT to increase hip strength and decrease left sided low back pain to return to regular activity pain free.  ? ? ? ?  ?  ?OBJECTIVE IMPAIRMENTS decreased strength, postural dysfunction, and pain.  ?   ?ACTIVITY LIMITATIONS laundry, shopping, and yard work.  ?  ?PERSONAL FACTORS 1 comorbidity: Scoliosis   are also affecting patient's functional outcome.  ?  ?  ?REHAB POTENTIAL: Good ?  ?CLINICAL DECISIO

## 2021-09-18 ENCOUNTER — Encounter: Payer: Self-pay | Admitting: Physical Therapy

## 2021-09-18 ENCOUNTER — Ambulatory Visit: Payer: Medicare Other | Admitting: Physical Therapy

## 2021-09-18 DIAGNOSIS — G8929 Other chronic pain: Secondary | ICD-10-CM

## 2021-09-18 DIAGNOSIS — M6281 Muscle weakness (generalized): Secondary | ICD-10-CM

## 2021-09-18 DIAGNOSIS — M546 Pain in thoracic spine: Secondary | ICD-10-CM | POA: Diagnosis not present

## 2021-09-18 NOTE — Therapy (Signed)
?OUTPATIENT PHYSICAL THERAPY TREATMENT NOTE ? ? ?Patient Name: Tiffany Costa ?MRN: [id number] ?DOB:Oct 21, 1955, 66 y.o., female ?Today's Date: 09/18/2021 ? ?PCP: Gladstone Lighter, MD ?REFERRING PROVIDER: Gladstone Lighter, MD ? ?END OF SESSION:  ? PT End of Session - 09/18/21 1108   ? ? Visit Number 7   ? Number of Visits 16   ? Date for PT Re-Evaluation 10/15/21   ? Authorization Type Medicare 2023   ? Authorization Time Period 08/20/21-10/15/21   ? Progress Note Due on Visit 10   ? PT Start Time 1105   ? PT Stop Time 1145   ? PT Time Calculation (min) 40 min   ? Activity Tolerance Patient tolerated treatment well   ? Behavior During Therapy [facility] for tasks assessed/performed   ? ?  ?  ? ?  ? ? ? ?Past Medical History:  ?Diagnosis Date  ? 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 Blood transfusion without reported diagnosis   ? Cancer [facility])   ? breast cancer  ? Heart murmur   ? Hyperfunctioning follicular adenoma of thyroid gland   ? Hyperlipidemia   ? Migraines   ? Personal history of radiation therapy 2010  ? Urine incontinence   ? ?Past Surgical History:  ?Procedure Laterality Date  ? ABDOMINAL HYSTERECTOMY    ? BREAST LUMPECTOMY Right 2010  ? LYMPHADENECTOMY    ? WRIST FRACTURE SURGERY  2017  ? ?Patient Active Problem List  ? Diagnosis Date Noted  ? Acute non-recurrent maxillary sinusitis 01/28/2018  ? 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 Anxiety 02/18/2011  ? 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 Breast cancer, stage 1 ([place]) 02/10/2011  ? ? ?REFERRING DIAG: Left sided thoracic back pain  ? ?THERAPY DIAG:  ?Chronic left-sided thoracic back pain ? ?Muscle weakness (generalized) ? ?PERTINENT HISTORY: [place] 06/25/21  ?  ?Tiffany Costa is a 66 y.o. female that presents to clinic today for evaluation and management of back pain at the referral of Radhika, Tressia Miners, MD. She has previously been established with our group but this is a new problem.  ?  ?At the time of this visit, I reviewed her most  recent evaluation by her PCP from 05/13/2021 for chronic low back and flank pain. She had previously been treated for a UTI in October 2022. She is known to have scoliosis. At that visit, she was recommended CT abdomen/pelvis with contrast which showed some diverticulosis and thoracolumbar scoliosis. She was called about these results and so she was recommended to have x-rays of the spine. She was also put on gabapentin. She had entire spine x-rays on 06/06/2021 which showed sigmoid thoracolumbar scoliosis with multiple level degenerative disc and facet changes. She was referred to orthopedics for this issue. Her most recent labs from 01/16/2021 show creatinine 0.8, normal electrolytes, normal TSH, normal vitamin D. She did have a urinalysis on 02/13/2021 that was slightly cloudy with positive nitrites, trace leukocyte Estrace, many bacteria, but no red blood cells/ketones/glucose. ?  ?Her pain began suddenly approximately 8 months ago with no acute trauma or injury. She denies any previous accident, injury, trauma, fall, strain to this area. She does note a remote fall where she hit her face and wonders if this could have exacerbated her pains. The pain is located over the left flank and radiates intermittently to the left abdomen. She denies any radiation of symptoms into her legs She describes her pain as intermittent, worsening, sharp, dull, throbbing, aching, nagging, intense, stabbing, clenching, and feeling like  a "spasm". It is aggravated by laying flat on her back at night and is worse at night. She currently rates pain severity as a 6/10. She reports associated pain at night. She denies associated swelling, bruising, skin color change, locking, catching, clicking, instability, limping, difficulty walking, numbness/tingling, weakness, fever/chills, nausea, night sweats, weight loss. She has tried Tylenol and Celecoxib. She also notes that using a more firm mattress has helped at times. She took gabapentin for 1  week but felt that it made her too drowsy in the morning so she stopped it.  ? ?PRECAUTIONS: None  ? ?SUBJECTIVE: Pt reports increased pain in low back that has been improving since morning and that this is typical for her. She notes increased stiffness in her left sidewall of torso and that she is having increased difficulty performing side bend exercise past her knee.  ?PAIN:  ?Are you having pain? Yes 1/10 ? ?OBJECTIVE:  ?  ?          ?             ?DIAGNOSTIC FINDINGS:  ?EXAM: ?Lumbar Spine Radiographs - 2 views (AP, Lateral) performed 06/06/2021 ?  ?FINDINGS:  ?There are 5 lumbar type vertebrae without evidence of compression injury. Normal lumbar lordosis. Moderate, sigmoid thoracolumbar scoliosis scoliosis. No listhesis. Multilevel degenerative disc and facet changes.  ?  ?  ?PATIENT SURVEYS:  ?FOTO 60 ?  ?Objective Tests and Measures.              ?           ? ? ?  ?LUMBAR ROM:  ?  ?Active  A/PROM  ?08/20/2021  ?Flexion 100%  ?Extension 100%  ?Right lateral flexion 100%  ?Left lateral flexion 100%  ?Right rotation 100%  ?Left rotation 100%  ? (Blank rows = not tested) ?  ?LE ROM: ?  ?Active  Right ?08/20/2021 Left ?08/20/2021  ?Hip flexion 120 120  ?Hip extension 30 30  ?Hip abduction 45 45  ?Hip adduction 30 30  ?Hip internal rotation 45 45  ?Hip external rotation 45 45  ?Knee flexion 135 135  ?Knee extension 0 0  ?Ankle dorsiflexion 20 20  ?Ankle plantarflexion 50 50  ?Ankle inversion      ?Ankle eversion      ? (Blank rows = not tested) ?  ?LE MMT: ?  ?MMT Right ?08/20/2021 Left ?08/20/2021  ?Hip flexion 5 5  ?Hip extension 4 4  ?Hip abduction 4 4  ?Hip adduction 4 4  ?Hip internal rotation 5 5  ?Hip external rotation 5 4  ?Knee flexion 5 5  ?Knee extension 5 5  ?Ankle dorsiflexion 5 5  ?Ankle plantarflexion      ?Ankle inversion      ?Ankle eversion      ? (Blank rows = not tested) ?  ? ?  ?  ?LUMBAR SPECIAL TESTS:  ?Straight leg raise test: Negative and Slump test: Negative ?  ?TODAY'S TREATMENT  ?09/18/21 ?Nu  Step seat and arms level 4- 5 min  ? ?Seated QL Stretch 3 x 30 sec  ? ?Standing Side Press for External Obliques with #5 DB  3 x 10  ?-min VC to maintain upright posture and added visual support with mirror  ? ?Prone Hip Extension and External Rotation Hip Flexor Stretch 2 x 30 sec  ?-Pt unable to place torso securely on table  ? ?Modified Thomas Stretch with increased overpressure from PT 4 x 30 sec  ?-Pt unable to  reproduce same stretch while actively flexing hip and knee in dynamic stretch  ? ?Abdominal and Hip Flexor Stretch using White Physioball 2 x 30 sec  ?-Difficult to setup patient without her falling off ball  ? ?Standing Lat Dorsi Stretch 2 x 30 sec  ?                       -Pt reports finding it difficult to perform exercise standing  ?                          ?                       Supine Lat Stretch 4 x 30 sec  ?                       -min to mod VC to sequence exercise  ? ?Gait Analysis: No significant deficits noted with symmetrical heel strike, stance time , and step length throughout ambulation. ? ?09/16/21  ? Nu step 5 min level seat 5 and arms 5  ? Hip Adduction Isometric 3 sec hold 1 x 10  ? Supine Bridges with hip adductor squeeze 3 x 10 ? Sidelying Hip Abduction 1 x 10  ? Sidelying Hip Abduction with Yellow TB  2 x 10  ? ? ?09/12/21 ? ?-nustep x5 min, level 1, seat 4, arms 7 ?-Rt trunk rotation stretch 3x30sec  ?-Green TB  clam 15x3secH  ?-Gluteal Bridge 1x15  ?-seated rt trunk rotation 1x15 c green tb ? ?PATIENT EDUCATION:  ?Education details: form and technique for appropriate exercise. Explanation about benefits of PT to improve back pain.  ?Person educated: Patient ?Education method: Explanation, Demonstration, Verbal cues, and Handouts ?Education comprehension: verbalized understanding, verbal cues required, tactile cues required, and needs further education ?  ?  ?HOME EXERCISE PROGRAM: ?Access Code: [email or web address] ?URL: [email or web address] ?Date: 09/18/2021 ?Prepared by:  Bradly Chris ? ?Exercises ?- Side Step Down with Counter Support  - 1 x daily - 3 x weekly - 3 sets - 10 reps ?- Supine Bridge with Mini Swiss Ball Between Knees  - 1 x daily - 3 x weekly - 3 sets - 1

## 2021-09-23 ENCOUNTER — Encounter: Payer: Medicare Other | Admitting: Physical Therapy

## 2021-09-24 ENCOUNTER — Encounter: Payer: Self-pay | Admitting: Physical Therapy

## 2021-09-24 ENCOUNTER — Ambulatory Visit: Payer: Medicare Other | Admitting: Physical Therapy

## 2021-09-24 DIAGNOSIS — M546 Pain in thoracic spine: Secondary | ICD-10-CM | POA: Diagnosis not present

## 2021-09-24 DIAGNOSIS — M6281 Muscle weakness (generalized): Secondary | ICD-10-CM

## 2021-09-24 DIAGNOSIS — G8929 Other chronic pain: Secondary | ICD-10-CM

## 2021-09-24 NOTE — Therapy (Signed)
OUTPATIENT PHYSICAL THERAPY TREATMENT NOTE   Patient Name: Tiffany Costa MRN: [id number] DOB:April 26, 1956, 66 y.o., female Today's Date: 09/24/2021  PCP: Gladstone Lighter, MD REFERRING PROVIDER: Diamond Nickel, DO  END OF SESSION:   PT End of Session - 09/24/21 1022     Visit Number 8    Number of Visits 16    Date for PT Re-Evaluation 10/15/21    Authorization Type Medicare 2023    Authorization Time Period 08/20/21-10/15/21    Progress Note Due on Visit 10    PT Start Time 1020    PT Stop Time 1100    PT Time Calculation (min) 40 min    Activity Tolerance Patient tolerated treatment well    Behavior During Therapy WFL for tasks assessed/performed              Past Medical History:  Diagnosis Date   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[place])    Blood transfusion without reported diagnosis    Cancer ([place])    breast cancer   Heart murmur    Hyperfunctioning follicular adenoma of thyroid gland    Hyperlipidemia    Migraines    Personal history of radiation therapy 2010   Urine incontinence    Past Surgical History:  Procedure Laterality Date   ABDOMINAL HYSTERECTOMY     BREAST LUMPECTOMY Right 2010   LYMPHADENECTOMY     WRIST FRACTURE SURGERY  2017   Patient Active Problem List   Diagnosis Date Noted   Acute non-recurrent maxillary sinusitis 01/28/2018   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 female that presents to clinic today for evaluation and management of back pain at the referral of Tiffany Costa, Tiffany Miners, MD. She has previously been established with our group but this is a new problem.    At the time of this visit, I reviewed her most  recent evaluation by her PCP from 05/13/2021 for chronic low back and flank pain. She had previously been treated for a UTI in October 2022. She is known to have scoliosis. At that visit, she was recommended CT abdomen/pelvis with contrast which showed some diverticulosis and thoracolumbar scoliosis. She was called about these results and so she was recommended to have x-rays of the spine. She was also put on gabapentin. She had entire spine x-rays on 06/06/2021 which showed sigmoid thoracolumbar scoliosis with multiple level degenerative disc and facet changes. She was referred to orthopedics for this issue. Her most recent labs from 01/16/2021 show creatinine 0.8, normal electrolytes, normal TSH, normal vitamin D. She did have a urinalysis on 02/13/2021 that was slightly cloudy with positive nitrites, trace leukocyte Estrace, many bacteria, but no red blood cells/ketones/glucose.   Her pain began suddenly approximately 8 months ago with no acute trauma or injury. She denies any previous accident, injury, trauma, fall, strain to this area. She does note a remote fall where she hit her face and wonders if this could have exacerbated her pains. The pain is located over the left flank and radiates intermittently to the left abdomen. She denies any radiation of symptoms into her legs She describes her pain as intermittent, worsening, sharp, dull, throbbing, aching, nagging, intense, stabbing, clenching, and feeling  like a "spasm". It is aggravated by laying flat on her back at night and is worse at night. She currently rates pain severity as a 6/10. She reports associated pain at night. She denies associated swelling, bruising, skin color change, locking, catching, clicking, instability, limping, difficulty walking, numbness/tingling, weakness, fever/chills, nausea, night sweats, weight loss. She has tried Tylenol and Celecoxib. She also notes that using a more firm mattress has helped at times. She took gabapentin for 1  week but felt that it made her too drowsy in the morning so she stopped it.   PRECAUTIONS: None   SUBJECTIVE: Pt reports that she continues to feel pain after exercises. She has been able to do more home exercises.   PAIN:  Are you having pain? Yes 1/10  OBJECTIVE:                           DIAGNOSTIC FINDINGS:  EXAM: Lumbar Spine Radiographs - 2 views (AP, Lateral) performed 06/06/2021   FINDINGS:  There are 5 lumbar type vertebrae without evidence of compression injury. Normal lumbar lordosis. Moderate, sigmoid thoracolumbar scoliosis scoliosis. No listhesis. Multilevel degenerative disc and facet changes.      PATIENT SURVEYS:  FOTO 60   Objective Tests and Measures.                             LUMBAR ROM:    Active  A/PROM  08/20/2021  Flexion 100%  Extension 100%  Right lateral flexion 100%  Left lateral flexion 100%  Right rotation 100%  Left rotation 100%   (Blank rows = not tested)   LE ROM:   Active  Right 08/20/2021 Left 08/20/2021  Hip flexion 120 120  Hip extension 30 30  Hip abduction 45 45  Hip adduction 30 30  Hip internal rotation 45 45  Hip external rotation 45 45  Knee flexion 135 135  Knee extension 0 0  Ankle dorsiflexion 20 20  Ankle plantarflexion 50 50  Ankle inversion      Ankle eversion       (Blank rows = not tested)   LE MMT:   MMT Right 08/20/2021 Left 08/20/2021  Hip flexion 5 5  Hip extension 4 4  Hip abduction 4 4  Hip adduction 4 4  Hip internal rotation 5 5  Hip external rotation 5 4  Knee flexion 5 5  Knee extension 5 5  Ankle dorsiflexion 5 5  Ankle plantarflexion      Ankle inversion      Ankle eversion       (Blank rows = not tested)        LUMBAR SPECIAL TESTS:  Straight leg raise test: Negative and Slump test: Negative   TODAY'S TREATMENT  09/24/20  TM at 2.0 mph for 5 min  Seated Hip Adduction 3 x 10 Supine Bridges with Hip Abduction into resistance band with YTB 2 x 10 Supine Bridge Marches 3 x  8  Side Lying Hip Abduction with  YTB 3 x 10   09/18/21 Nu Step seat and arms level 4- 5 min   Seated QL Stretch 3 x 30 sec   Standing Side Press for External Obliques with #5 DB  3 x 10  -min VC to maintain upright posture and added visual support with mirror   Prone Hip Extension and External Rotation Hip Flexor Stretch 2 x 30 sec  -Pt  unable to place torso securely on table   Modified [employer] with increased overpressure from PT 4 x 30 sec  -Pt unable to reproduce same stretch while actively flexing hip and knee in dynamic stretch   Abdominal and Hip Flexor Stretch using White Physioball 2 x 30 sec  -Difficult to setup patient without her falling off ball   Standing Lat Dorsi Stretch 2 x 30 sec                         -Pt reports finding it difficult to perform exercise standing                                                   Supine Lat Stretch 4 x 30 sec                         -min to mod VC to sequence exercise   Gait Analysis: No significant deficits noted with symmetrical heel strike, stance time , and step length throughout ambulation.  09/16/21   Nu step 5 min level seat 5 and arms 5   Hip Adduction Isometric 3 sec hold 1 x 10   Supine Bridges with hip adductor squeeze 3 x 10  Sidelying Hip Abduction 1 x 10   Sidelying Hip Abduction with Yellow TB  2 x 10     PATIENT EDUCATION:  Education details: form and technique for appropriate exercise. Explanation about benefits of PT to improve back pain.  Person educated: Patient Education method: Explanation, Demonstration, Verbal cues, and Handouts Education comprehension: verbalized understanding, verbal cues required, tactile cues required, and needs further education     HOME EXERCISE PROGRAM: Access Code: [email or web address] URL: [email or web address] Date: 09/24/2021 Prepared by: Bradly Chris  Exercises - Latissimus Dorsi Stretch at Wall  - 1 x daily - 7 x weekly - 1 sets - 3 reps - 30 hold -  Seated Quadratus Lumborum Stretch in Chair  - 1 x daily - 7 x weekly - 1 sets - 3 reps - 30 hold - Sidelying Hip Abduction  - 1 x daily - 3 x weekly - 3 sets - 10 reps - Sidelying Hip Adduction  - 1 x daily - 3 x weekly - 3 sets - 10 reps - Shoulder Bridge Prep with Continuous Marching  - 1 x daily - 3 x weekly - 3 sets - 8 reps   ASSESSMENT:   CLINICAL IMPRESSION:  Pt exhibits improved hip strength with ability to perform exercise progressions for hip extension and adduction with no increase in her pain.  She will continue to benefit from skilled PT to increase hip strength and decrease left sided low back pain to return to regular activity pain free.     OBJECTIVE IMPAIRMENTS decreased strength, postural dysfunction, and pain.    ACTIVITY LIMITATIONS laundry, shopping, and yard work.    PERSONAL FACTORS 1 comorbidity: Scoliosis   are also affecting patient's functional outcome.      REHAB POTENTIAL: Good   CLINICAL DECISION MAKING: Stable/uncomplicated   EVALUATION COMPLEXITY: Low     GOALS: Goals reviewed with patient? No   SHORT TERM GOALS: Target date: 09/03/2021   Pt will be independent with HEP in order to improve strength and balance in order to decrease fall risk  and improve function at home and work.   Baseline: NT  Goal status: INITIAL       LONG TERM GOALS: Target date: 10/15/2021   Patient will have improved function and activity level as evidenced by an increase in FOTO score by 10 points or more.  Baseline: 60 Goal status: INITIAL   2.  Patient will improve hip strength by 1/2>= grade MMT to provide increased stability for spine to avoid spine absorbing abnormal forces. Baseline: Hip Ext R/L 4/4, Hip Abd R/L 4/4, Hip Aduc R/L 4/4, Hip ER R 4 Goal status: INITIAL         PLAN: PT FREQUENCY: 1-2x/week   PT DURATION: 8 weeks   PLANNED INTERVENTIONS: Therapeutic exercises, Therapeutic activity, Neuromuscular re-education, Balance training, Gait  training, Patient/Family education, Joint manipulation, Joint mobilization, Aquatic Therapy, Dry Needling, Cryotherapy, Moist heat, and Manual therapy.   PLAN FOR NEXT SESSION: Strengthening of left side. Progress hip and core strengthening exercises.    Bradly Chris PT, DPT   11:05 AM, 09/24/21 Physical Therapist - [place] [phone] (Office)

## 2021-09-26 ENCOUNTER — Ambulatory Visit: Payer: Medicare Other | Admitting: Physical Therapy

## 2021-09-26 ENCOUNTER — Encounter: Payer: Self-pay | Admitting: Physical Therapy

## 2021-09-26 DIAGNOSIS — M546 Pain in thoracic spine: Secondary | ICD-10-CM | POA: Diagnosis not present

## 2021-09-26 DIAGNOSIS — M6281 Muscle weakness (generalized): Secondary | ICD-10-CM

## 2021-09-26 DIAGNOSIS — G8929 Other chronic pain: Secondary | ICD-10-CM

## 2021-09-26 NOTE — Therapy (Signed)
OUTPATIENT PHYSICAL THERAPY TREATMENT NOTE   Patient Name: Tiffany Costa MRN: [id number] DOB:09/14/1955, 66 y.o., female Today's Date: 09/26/2021  PCP: Gladstone Lighter, MD REFERRING PROVIDER: Gladstone Lighter, MD  END OF SESSION:   PT End of Session - 09/26/21 1156     Visit Number 9    Number of Visits 16    Date for PT Re-Evaluation 10/15/21    Authorization Type Medicare 2023    Authorization Time Period 08/20/21-10/15/21    Progress Note Due on Visit 10    PT Start Time 1150    PT Stop Time 1230    PT Time Calculation (min) 40 min    Activity Tolerance Patient tolerated treatment well    Behavior During Therapy WFL for tasks assessed/performed              Past Medical History:  Diagnosis Date   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[place])    Blood transfusion without reported diagnosis    Cancer ([place])    breast cancer   Heart murmur    Hyperfunctioning follicular adenoma of thyroid gland    Hyperlipidemia    Migraines    Personal history of radiation therapy 2010   Urine incontinence    Past Surgical History:  Procedure Laterality Date   ABDOMINAL HYSTERECTOMY     BREAST LUMPECTOMY Right 2010   LYMPHADENECTOMY     WRIST FRACTURE SURGERY  2017   Patient Active Problem List   Diagnosis Date Noted   Acute non-recurrent maxillary sinusitis 01/28/2018   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 female that presents to clinic today for evaluation and management of back pain at the referral of Radhika, Tressia Miners, MD. She has previously been established with our group but this is a new problem.    At the time of this visit, I reviewed her most  recent evaluation by her PCP from 05/13/2021 for chronic low back and flank pain. She had previously been treated for a UTI in October 2022. She is known to have scoliosis. At that visit, she was recommended CT abdomen/pelvis with contrast which showed some diverticulosis and thoracolumbar scoliosis. She was called about these results and so she was recommended to have x-rays of the spine. She was also put on gabapentin. She had entire spine x-rays on 06/06/2021 which showed sigmoid thoracolumbar scoliosis with multiple level degenerative disc and facet changes. She was referred to orthopedics for this issue. Her most recent labs from 01/16/2021 show creatinine 0.8, normal electrolytes, normal TSH, normal vitamin D. She did have a urinalysis on 02/13/2021 that was slightly cloudy with positive nitrites, trace leukocyte Estrace, many bacteria, but no red blood cells/ketones/glucose.   Her pain began suddenly approximately 8 months ago with no acute trauma or injury. She denies any previous accident, injury, trauma, fall, strain to this area. She does note a remote fall where she hit her face and wonders if this could have exacerbated her pains. The pain is located over the left flank and radiates intermittently to the left abdomen. She denies any radiation of symptoms into her legs She describes her pain as intermittent, worsening, sharp, dull, throbbing, aching, nagging, intense, stabbing, clenching, and feeling like  a "spasm". It is aggravated by laying flat on her back at night and is worse at night. She currently rates pain severity as a 6/10. She reports associated pain at night. She denies associated swelling, bruising, skin color change, locking, catching, clicking, instability, limping, difficulty walking, numbness/tingling, weakness, fever/chills, nausea, night sweats, weight loss. She has tried Tylenol and Celecoxib. She also notes that using a more firm mattress has helped at times. She took gabapentin for 1  week but felt that it made her too drowsy in the morning so she stopped it.   PRECAUTIONS: None   SUBJECTIVE: Pt states that she feels like her pain is improving and that she is able to more consistently perform exercises now that they have been reduced. She typically   PAIN:  Are you having pain? No Pain, 0/10  OBJECTIVE:                           DIAGNOSTIC FINDINGS:  EXAM: Lumbar Spine Radiographs - 2 views (AP, Lateral) performed 06/06/2021   FINDINGS:  There are 5 lumbar type vertebrae without evidence of compression injury. Normal lumbar lordosis. Moderate, sigmoid thoracolumbar scoliosis scoliosis. No listhesis. Multilevel degenerative disc and facet changes.      PATIENT SURVEYS:  FOTO 60   Objective Tests and Measures.                             LUMBAR ROM:    Active  A/PROM  08/20/2021  Flexion 100%  Extension 100%  Right lateral flexion 100%  Left lateral flexion 100%  Right rotation 100%  Left rotation 100%   (Blank rows = not tested)   LE ROM:   Active  Right 08/20/2021 Left 08/20/2021  Hip flexion 120 120  Hip extension 30 30  Hip abduction 45 45  Hip adduction 30 30  Hip internal rotation 45 45  Hip external rotation 45 45  Knee flexion 135 135  Knee extension 0 0  Ankle dorsiflexion 20 20  Ankle plantarflexion 50 50  Ankle inversion      Ankle eversion       (Blank rows = not tested)   LE MMT:   MMT Right 08/20/2021 Left 08/20/2021  Hip flexion 5 5  Hip extension 4 4  Hip abduction 4 4  Hip adduction 4 4  Hip internal rotation 5 5  Hip external rotation 5 4  Knee flexion 5 5  Knee extension 5 5  Ankle dorsiflexion 5 5  Ankle plantarflexion      Ankle inversion      Ankle eversion       (Blank rows = not tested)        LUMBAR SPECIAL TESTS:  Straight leg raise test: Negative and Slump test: Negative   TODAY'S TREATMENT  09/26/21 Nu-Step with 4 seat length and 3 resistance for 6 min   Lumbar Lateral Flexion with #10 DB 3  x 10  Trunk Rotation to Right with Green TB 1 x 10  -Pt reports increased right thoracic spine pain     Trunk Rotation to Right with #5 DB 3 x 10   09/24/20  TM at 2.0 mph for 5 min  Seated Hip Adduction 3 x 10 Supine Bridges with Hip Abduction into resistance band with YTB 2 x 10 Supine Bridge Marches 3 x 8  Side Lying Hip Abduction with  YTB 3 x  10   09/18/21 Nu Step seat and arms level 4- 5 min   Seated QL Stretch 3 x 30 sec   Standing Side Press for External Obliques with #5 DB  3 x 10  -min VC to maintain upright posture and added visual support with mirror   Prone Hip Extension and External Rotation Hip Flexor Stretch 2 x 30 sec  -Pt unable to place torso securely on table   Modified [employer] with increased overpressure from PT 4 x 30 sec  -Pt unable to reproduce same stretch while actively flexing hip and knee in dynamic stretch   Abdominal and Hip Flexor Stretch using White Physioball 2 x 30 sec  -Difficult to setup patient without her falling off ball   Standing Lat Dorsi Stretch 2 x 30 sec                         -Pt reports finding it difficult to perform exercise standing                                                   Supine Lat Stretch 4 x 30 sec                         -min to mod VC to sequence exercise   Gait Analysis: No significant deficits noted with symmetrical heel strike, stance time , and step length throughout ambulation.  09/16/21   Nu step 5 min level seat 5 and arms 5   Hip Adduction Isometric 3 sec hold 1 x 10   Supine Bridges with hip adductor squeeze 3 x 10  Sidelying Hip Abduction 1 x 10   Sidelying Hip Abduction with Yellow TB  2 x 10     PATIENT EDUCATION:  Education details: form and technique for appropriate exercise. Explanation about benefits of PT to improve back pain.  Person educated: Patient Education method: Explanation, Demonstration, Verbal cues, and Handouts Education comprehension: verbalized understanding,  verbal cues required, tactile cues required, and needs further education     HOME EXERCISE PROGRAM: Access Code: [email or web address] URL: [email or web address] Date: 09/24/2021 Prepared by: Bradly Chris  Exercises - Latissimus Dorsi Stretch at Wall  - 1 x daily - 7 x weekly - 1 sets - 3 reps - 30 hold - Seated Quadratus Lumborum Stretch in Chair  - 1 x daily - 7 x weekly - 1 sets - 3 reps - 30 hold - Sidelying Hip Abduction  - 1 x daily - 3 x weekly - 3 sets - 10 reps - Sidelying Hip Adduction  - 1 x daily - 3 x weekly - 3 sets - 10 reps - Shoulder Bridge Prep with Continuous Marching  - 1 x daily - 3 x weekly - 3 sets - 8 reps   ASSESSMENT:   CLINICAL IMPRESSION: Pt demonstrates improved pain response with ability to complete all exercises without an increase in her left sided back pain. She did experience some pain in thoracic pain with weighted trunk rotation. She is nearing the end of her POC and next session will be a reassessment of her goals to determine her progress. She will continue to benefit from skilled PT to increase hip strength and decrease left sided low back pain to return to regular activity pain  free.     OBJECTIVE IMPAIRMENTS decreased strength, postural dysfunction, and pain.    ACTIVITY LIMITATIONS laundry, shopping, and yard work.    PERSONAL FACTORS 1 comorbidity: Scoliosis   are also affecting patient's functional outcome.      REHAB POTENTIAL: Good   CLINICAL DECISION MAKING: Stable/uncomplicated   EVALUATION COMPLEXITY: Low     GOALS: Goals reviewed with patient? No   SHORT TERM GOALS: Target date: 09/03/2021   Pt will be independent with HEP in order to improve strength and balance in order to decrease fall risk and improve function at home and work.   Baseline: NT  Goal status: INITIAL       LONG TERM GOALS: Target date: 10/15/2021   Patient will have improved function and activity level as evidenced by an increase in FOTO score by 10  points or more.  Baseline: 60 Goal status: INITIAL   2.  Patient will improve hip strength by 1/2>= grade MMT to provide increased stability for spine to avoid spine absorbing abnormal forces. Baseline: Hip Ext R/L 4/4, Hip Abd R/L 4/4, Hip Aduc R/L 4/4, Hip ER R 4 Goal status: INITIAL         PLAN: PT FREQUENCY: 1-2x/week   PT DURATION: 8 weeks   PLANNED INTERVENTIONS: Therapeutic exercises, Therapeutic activity, Neuromuscular re-education, Balance training, Gait training, Patient/Family education, Joint manipulation, Joint mobilization, Aquatic Therapy, Dry Needling, Cryotherapy, Moist heat, and Manual therapy.   PLAN FOR NEXT SESSION: Reassess goals. Strengthening of left side. Progress hip and core strengthening exercises.    Bradly Chris PT, DPT   1:48 PM, 09/26/21 Physical Therapist - [place] [phone] (Office)

## 2021-10-01 ENCOUNTER — Ambulatory Visit: Payer: Medicare Other | Admitting: Physical Therapy

## 2021-10-01 ENCOUNTER — Encounter: Payer: Self-pay | Admitting: Physical Therapy

## 2021-10-01 DIAGNOSIS — M6281 Muscle weakness (generalized): Secondary | ICD-10-CM

## 2021-10-01 DIAGNOSIS — M546 Pain in thoracic spine: Secondary | ICD-10-CM | POA: Diagnosis not present

## 2021-10-01 NOTE — Therapy (Signed)
OUTPATIENT PHYSICAL THERAPY PROGRESS NOTE   Patient Name: Tiffany Costa MRN: [id number] DOB:1956/01/01, 66 y.o., female Today's Date: 10/01/2021  PCP: Gladstone Lighter, MD REFERRING PROVIDER: Diamond Nickel, DO  END OF SESSION:   PT End of Session - 10/01/21 1107     Visit Number 10    Number of Visits 16    Date for PT Re-Evaluation 10/15/21    Authorization Type Medicare 2023    Authorization Time Period 08/20/21-10/15/21    Progress Note Due on Visit 10    PT Start Time 1105    PT Stop Time 1324    PT Time Calculation (min) 40 min    Activity Tolerance Patient tolerated treatment well    Behavior During Therapy WFL for tasks assessed/performed              Past Medical History:  Diagnosis Date   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[place])    Blood transfusion without reported diagnosis    Cancer ([place])    breast cancer   Heart murmur    Hyperfunctioning follicular adenoma of thyroid gland    Hyperlipidemia    Migraines    Personal history of radiation therapy 2010   Urine incontinence    Past Surgical History:  Procedure Laterality Date   ABDOMINAL HYSTERECTOMY     BREAST LUMPECTOMY Right 2010   LYMPHADENECTOMY     WRIST FRACTURE SURGERY  2017   Patient Active Problem List   Diagnosis Date Noted   Acute non-recurrent maxillary sinusitis 01/28/2018   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 female that presents to clinic today for evaluation and management of back pain at the referral of Radhika, Tressia Miners, MD. She has previously been established with our group but this is a new problem.    At the time of this visit, I reviewed her most  recent evaluation by her PCP from 05/13/2021 for chronic low back and flank pain. She had previously been treated for a UTI in October 2022. She is known to have scoliosis. At that visit, she was recommended CT abdomen/pelvis with contrast which showed some diverticulosis and thoracolumbar scoliosis. She was called about these results and so she was recommended to have x-rays of the spine. She was also put on gabapentin. She had entire spine x-rays on 06/06/2021 which showed sigmoid thoracolumbar scoliosis with multiple level degenerative disc and facet changes. She was referred to orthopedics for this issue. Her most recent labs from 01/16/2021 show creatinine 0.8, normal electrolytes, normal TSH, normal vitamin D. She did have a urinalysis on 02/13/2021 that was slightly cloudy with positive nitrites, trace leukocyte Estrace, many bacteria, but no red blood cells/ketones/glucose.   Her pain began suddenly approximately 8 months ago with no acute trauma or injury. She denies any previous accident, injury, trauma, fall, strain to this area. She does note a remote fall where she hit her face and wonders if this could have exacerbated her pains. The pain is located over the left flank and radiates intermittently to the left abdomen. She denies any radiation of symptoms into her legs She describes her pain as intermittent, worsening, sharp, dull, throbbing, aching, nagging, intense, stabbing, clenching, and feeling  like a "spasm". It is aggravated by laying flat on her back at night and is worse at night. She currently rates pain severity as a 6/10. She reports associated pain at night. She denies associated swelling, bruising, skin color change, locking, catching, clicking, instability, limping, difficulty walking, numbness/tingling, weakness, fever/chills, nausea, night sweats, weight loss. She has tried Tylenol and Celecoxib. She also notes that using a more firm mattress has helped at times. She took gabapentin for 1  week but felt that it made her too drowsy in the morning so she stopped it.   PRECAUTIONS: None   SUBJECTIVE: Pt reports that she is continuing to feel in her left sided flank pain and it has improved slightly since her evaluation. She still continues to experience the pain at night. The exercises have helped her cope with pain better    Pt states that she feels like her pain is improving and that she is able to more consistently perform exercises now that they have been reduced. She typically   PAIN:  Are you having pain? 2/10 in left side of low back  OBJECTIVE:                           DIAGNOSTIC FINDINGS:  EXAM: Lumbar Spine Radiographs - 2 views (AP, Lateral) performed 06/06/2021   FINDINGS:  There are 5 lumbar type vertebrae without evidence of compression injury. Normal lumbar lordosis. Moderate, sigmoid thoracolumbar scoliosis scoliosis. No listhesis. Multilevel degenerative disc and facet changes.      PATIENT SURVEYS:  FOTO 60            10/01/21: 57   Objective Tests and Measures.                             LUMBAR ROM:    Active  A/PROM  08/20/2021  Flexion 100%  Extension 100%  Right lateral flexion 100%  Left lateral flexion 100%  Right rotation 100%  Left rotation 100%   (Blank rows = not tested)   LE ROM:   Active  Right 08/20/2021 Left 08/20/2021  Hip flexion 120 120  Hip extension 30 30  Hip abduction 45 45  Hip adduction 30 30  Hip internal rotation 45 45  Hip external rotation 45 45  Knee flexion 135 135  Knee extension 0 0  Ankle dorsiflexion 20 20  Ankle plantarflexion 50 50  Ankle inversion      Ankle eversion       (Blank rows = not tested)   LE MMT:   MMT Right 08/20/2021 Left 08/20/2021 Right  10/01/21 Left    Hip flexion 5 5    Hip extension 4 4 4+ 5  Hip abduction '4 4 5 5  '$ Hip adduction '4 4 5 5  '$ Hip internal rotation 5 5    Hip external rotation '5 4  5  '$ Knee flexion 5 5    Knee extension 5 5    Ankle dorsiflexion 5 5     Ankle plantarflexion        Ankle inversion        Ankle eversion         (Blank rows = not tested)        LUMBAR SPECIAL TESTS:  Straight leg raise test: Negative and Slump test: Negative   TODAY'S TREATMENT  10/01/21 TM 5 min at 2.5 mph  FOTO 57 Hip MMT:     -  Hip Ext R/L 4+/5     -Hip Abd R/L 5/5     -Hip Add R/L 5/5     -Hip Ext L 5  Review of frequency and rationale for extending plan of care. Discussed HEP compliance and need to hit frequency for therapeutic benefit.     09/26/21 Nu-Step with 4 seat length and 3 resistance for 6 min   Lumbar Lateral Flexion with #10 DB 3 x 10  Trunk Rotation to Right with Green TB 1 x 10  -Pt reports increased right thoracic spine pain     Trunk Rotation to Right with #5 DB 3 x 10   09/24/20  TM at 2.0 mph for 5 min  Seated Hip Adduction 3 x 10 Supine Bridges with Hip Abduction into resistance band with YTB 2 x 10 Supine Bridge Marches 3 x 8  Side Lying Hip Abduction with  YTB 3 x 10     PATIENT EDUCATION:  Education details: form and technique for appropriate exercise. Explanation about benefits of PT to improve back pain.  Person educated: Patient Education method: Explanation, Demonstration, Verbal cues, and Handouts Education comprehension: verbalized understanding, verbal cues required, tactile cues required, and needs further education     HOME EXERCISE PROGRAM: Access Code: [email or web address] URL: [email or web address] Date: 10/01/2021 Prepared by: Bradly Chris  Exercises - Seated Quadratus Lumborum Stretch in Chair  - 1 x daily - 7 x weekly - 1 sets - 3 reps - 30 hold - Sidelying Hip Abduction  - 1 x daily - 3 x weekly - 3 sets - 10 reps - Sidelying Hip Adduction  - 1 x daily - 3 x weekly - 3 sets - 10 reps - Shoulder Bridge Prep with Continuous Marching  - 1 x daily - 3 x weekly - 3 sets - 8 reps Supine Lat Stretch 3 x 30 sec x 7 days per week    ASSESSMENT:   CLINICAL IMPRESSION: Pt presents for  re-evaluation of goals. She exhibits improved hip strength, but continues to experience similar low back pain restricting her perceived functional ability. Discussed need for improved adherence to HEP, and that pt would benefit from further time to improve adherence. As a result, PT frequency decreased to 1x per week to allow pt more time to carryout HEP and to check in about accuracy of HEP performance and pain response. Will continue for 6 more weeks to allow sufficient time to see if there is a change in patient symptoms.     OBJECTIVE IMPAIRMENTS decreased strength, postural dysfunction, and pain.    ACTIVITY LIMITATIONS laundry, shopping, and yard work.    PERSONAL FACTORS 1 comorbidity: Scoliosis   are also affecting patient's functional outcome.      REHAB POTENTIAL: Good   CLINICAL DECISION MAKING: Stable/uncomplicated   EVALUATION COMPLEXITY: Low     GOALS: Goals reviewed with patient? No   SHORT TERM GOALS: Target date: 09/03/2021   Pt will be independent with HEP in order to improve strength and balance in order to decrease fall risk and improve function at home and work.   Baseline: Able to perform exercises independently  Goal status: INITIAL       LONG TERM GOALS: Target date: 10/15/2021   Patient will have improved function and activity level as evidenced by an increase in FOTO score by 10 points or more.  Baseline: 60, 10/01/21  Goal status: INITIAL   2.  Patient will improve hip strength by 1/2>= grade MMT to provide increased  stability for spine to avoid spine absorbing abnormal forces. Baseline: Hip Ext R/L 4/4, Hip Abd R/L 4/4, Hip Aduc R/L 4/4, Hip ER R 4                              10/01/21 Hip Abd R/L 5/5 Hip Add R/L 5/5 Hip Ext 4+/5   Goal status: ACHIEVED          PLAN: PT FREQUENCY: 1-2x/2 week   PT DURATION: 6 weeks   PLANNED INTERVENTIONS: Therapeutic exercises, Therapeutic activity, Neuromuscular re-education, Balance training, Gait training,  Patient/Family education, Joint manipulation, Joint mobilization, Aquatic Therapy, Dry Needling, Cryotherapy, Moist heat, and Manual therapy.   PLAN FOR NEXT SESSION: Strengthening of left side. Progress hip and core strengthening exercises.    Bradly Chris PT, DPT   11:09 AM, 10/01/21 Physical Therapist - [place] [phone] (Office)

## 2021-10-03 ENCOUNTER — Encounter: Payer: Medicare Other | Admitting: Physical Therapy

## 2021-10-08 ENCOUNTER — Ambulatory Visit: Payer: Medicare Other | Attending: Sports Medicine | Admitting: Physical Therapy

## 2021-10-08 ENCOUNTER — Encounter: Payer: Self-pay | Admitting: Physical Therapy

## 2021-10-08 DIAGNOSIS — M6281 Muscle weakness (generalized): Secondary | ICD-10-CM | POA: Insufficient documentation

## 2021-10-08 DIAGNOSIS — G8929 Other chronic pain: Secondary | ICD-10-CM | POA: Insufficient documentation

## 2021-10-08 DIAGNOSIS — M546 Pain in thoracic spine: Secondary | ICD-10-CM | POA: Diagnosis present

## 2021-10-08 NOTE — Therapy (Addendum)
OUTPATIENT PHYSICAL THERAPY PROGRESS NOTE/ Re-certification    Dates of Reporting: 10/16/21-12/16/21  Patient Name: Tiffany Costa MRN: [id number] DOB:December 07, 1955, 66 y.o., female Today's Date: 10/08/2021  PCP: Gladstone Lighter, MD REFERRING PROVIDER: Diamond Nickel, DO  END OF SESSION:   PT End of Session - 10/08/21 0938     Visit Number 11    Number of Visits 16    Date for PT Re-Evaluation 10/15/21    Authorization Type Medicare 2023    Authorization Time Period  10/16/21-12/16/21   Progress Note Due on Visit 10    PT Start Time 0935    PT Stop Time 1015    PT Time Calculation (min) 40 min    Activity Tolerance Patient tolerated treatment well    Behavior During Therapy WFL for tasks assessed/performed              Past Medical History:  Diagnosis Date   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[place])    Blood transfusion without reported diagnosis    Cancer ([place])    breast cancer   Heart murmur    Hyperfunctioning follicular adenoma of thyroid gland    Hyperlipidemia    Migraines    Personal history of radiation therapy 2010   Urine incontinence    Past Surgical History:  Procedure Laterality Date   ABDOMINAL HYSTERECTOMY     BREAST LUMPECTOMY Right 2010   LYMPHADENECTOMY     WRIST FRACTURE SURGERY  2017   Patient Active Problem List   Diagnosis Date Noted   Acute non-recurrent maxillary sinusitis 01/28/2018   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 female that presents to clinic today for evaluation and management of back pain at the referral of Radhika, Tressia Miners, MD. She has previously been established with our group but this is a new  problem.    At the time of this visit, I reviewed her most recent evaluation by her PCP from 05/13/2021 for chronic low back and flank pain. She had previously been treated for a UTI in October 2022. She is known to have scoliosis. At that visit, she was recommended CT abdomen/pelvis with contrast which showed some diverticulosis and thoracolumbar scoliosis. She was called about these results and so she was recommended to have x-rays of the spine. She was also put on gabapentin. She had entire spine x-rays on 06/06/2021 which showed sigmoid thoracolumbar scoliosis with multiple level degenerative disc and facet changes. She was referred to orthopedics for this issue. Her most recent labs from 01/16/2021 show creatinine 0.8, normal electrolytes, normal TSH, normal vitamin D. She did have a urinalysis on 02/13/2021 that was slightly cloudy with positive nitrites, trace leukocyte Estrace, many bacteria, but no red blood cells/ketones/glucose.   Her pain began suddenly approximately 8 months ago with no acute trauma or injury. She denies any previous accident, injury, trauma, fall, strain to this area. She does note a remote fall where she hit her face and wonders if this could have exacerbated her pains. The pain is located over the left flank and radiates intermittently to the left abdomen. She denies any radiation of symptoms into her legs She describes her pain as intermittent, worsening, sharp, dull, throbbing,  aching, nagging, intense, stabbing, clenching, and feeling like a "spasm". It is aggravated by laying flat on her back at night and is worse at night. She currently rates pain severity as a 6/10. She reports associated pain at night. She denies associated swelling, bruising, skin color change, locking, catching, clicking, instability, limping, difficulty walking, numbness/tingling, weakness, fever/chills, nausea, night sweats, weight loss. She has tried Tylenol and Celecoxib. She also notes that using a more  firm mattress has helped at times. She took gabapentin for 1 week but felt that it made her too drowsy in the morning so she stopped it.   PRECAUTIONS: None   SUBJECTIVE: Pt states that she has been feeling slightly better since last session. She has been able to consistently able to do exercises which has made a difference.    PAIN:  Are you having pain? 1/10 in left side of low back  OBJECTIVE:                           DIAGNOSTIC FINDINGS:  EXAM: Lumbar Spine Radiographs - 2 views (AP, Lateral) performed 06/06/2021   FINDINGS:  There are 5 lumbar type vertebrae without evidence of compression injury. Normal lumbar lordosis. Moderate, sigmoid thoracolumbar scoliosis scoliosis. No listhesis. Multilevel degenerative disc and facet changes.      PATIENT SURVEYS:  FOTO 60            10/01/21: 57   Objective Tests and Measures.                             LUMBAR ROM:    Active  A/PROM  08/20/2021  Flexion 100%  Extension 100%  Right lateral flexion 100%  Left lateral flexion 100%  Right rotation 100%  Left rotation 100%   (Blank rows = not tested)   LE ROM:   Active  Right 08/20/2021 Left 08/20/2021  Hip flexion 120 120  Hip extension 30 30  Hip abduction 45 45  Hip adduction 30 30  Hip internal rotation 45 45  Hip external rotation 45 45  Knee flexion 135 135  Knee extension 0 0  Ankle dorsiflexion 20 20  Ankle plantarflexion 50 50  Ankle inversion      Ankle eversion       (Blank rows = not tested)   LE MMT:   MMT Right 08/20/2021 Left 08/20/2021 Right  10/01/21 Left    Hip flexion 5 5    Hip extension 4 4 4+ 5  Hip abduction '4 4 5 5  '$ Hip adduction '4 4 5 5  '$ Hip internal rotation 5 5    Hip external rotation '5 4  5  '$ Knee flexion 5 5    Knee extension 5 5    Ankle dorsiflexion 5 5    Ankle plantarflexion        Ankle inversion        Ankle eversion         (Blank rows = not tested)        LUMBAR SPECIAL TESTS:  Straight leg raise test:  Negative and Slump test: Negative   TODAY'S TREATMENT  10/08/21 TM 2.5 mph for 6 min   Pallof Press 3 x 10 with #5 on OMEGA machine   Pallof Press with Blue TB 2 x 10   Quadruped T's with #3 DB 3 x 10    10/01/21 TM 5 min at 2.5  mph  FOTO 57 Hip MMT:     -Hip Ext R/L 4+/5     -Hip Abd R/L 5/5     -Hip Add R/L 5/5     -Hip Ext L 5  Review of frequency and rationale for extending plan of care. Discussed HEP compliance and need to hit frequency for therapeutic benefit.     09/26/21 Nu-Step with 4 seat length and 3 resistance for 6 min   Lumbar Lateral Flexion with #10 DB 3 x 10  Trunk Rotation to Right with Green TB 1 x 10  -Pt reports increased right thoracic spine pain     Trunk Rotation to Right with #5 DB 3 x 10      PATIENT EDUCATION:  Education details: form and technique for appropriate exercise. Explanation about benefits of PT to improve back pain.  Person educated: Patient Education method: Explanation, Demonstration, Verbal cues, and Handouts Education comprehension: verbalized understanding, verbal cues required, tactile cues required, and needs further education     HOME EXERCISE PROGRAM: Access Code: [email or web address] URL: [email or web address] Date: 10/08/2021 Prepared by: Bradly Chris  Exercises - Seated Quadratus Lumborum Stretch in Chair  - 1 x daily - 7 x weekly - 1 sets - 3 reps - 30 hold - Sidelying Hip Abduction  - 1 x daily - 3 x weekly - 3 sets - 10 reps - Sidelying Hip Adduction  - 1 x daily - 3 x weekly - 3 sets - 10 reps - Shoulder Bridge Prep with Continuous Marching  - 1 x daily - 3 x weekly - 3 sets - 8 reps - Standing Anti-Rotation Press with Anchored Resistance  - 1 x daily - 3 x weekly - 3 sets - 10 reps - 2 hold   ASSESSMENT:   CLINICAL IMPRESSION:  Pt continues to show left sided weakness especially in abdominal region as evidenced by difficulty with anti-derotation exercises. While pt reports increased adherence to HEP, it  is still not clear if she is meeting frequency for exercises. PT encouraged pt to continue with HEP and to do her best to meet frequency for HEP to see if this will make a difference with left sided pain. Frequency adjusted to allow pt more time to follow HEP and determine whether this is making an impact on symptoms. Will follow up in two weeks to modify HEP and to determine progress.     OBJECTIVE IMPAIRMENTS decreased strength, postural dysfunction, and pain.    ACTIVITY LIMITATIONS laundry, shopping, and yard work.    PERSONAL FACTORS 1 comorbidity: Scoliosis   are also affecting patient's functional outcome.      REHAB POTENTIAL: Good   CLINICAL DECISION MAKING: Stable/uncomplicated   EVALUATION COMPLEXITY: Low     GOALS: Goals reviewed with patient? No   SHORT TERM GOALS: Target date: 09/03/2021   Pt will be independent with HEP in order to improve strength and balance in order to decrease fall risk and improve function at home and work.   Baseline: Able to perform exercises independently  Goal status: ONGOING        LONG TERM GOALS: Target date: 10/15/2021   Patient will have improved function and activity level as evidenced by an increase in FOTO score by 10 points or more.  Baseline: 60, 10/01/21: 57/64 Goal status: ONGOING    2.  Patient will improve hip strength by 1/2>= grade MMT to provide increased stability for spine to avoid spine absorbing abnormal forces. Baseline: Hip Ext R/L 4/4, Hip  Abd R/L 4/4, Hip Aduc R/L 4/4, Hip ER R 4                              10/01/21 Hip Abd R/L 5/5 Hip Add R/L 5/5 Hip Ext 4+/5   Goal status: ACHIEVED          PLAN: PT FREQUENCY: 1-2x/2 week   PT DURATION: 6 weeks   PLANNED INTERVENTIONS: Therapeutic exercises, Therapeutic activity, Neuromuscular re-education, Balance training, Gait training, Patient/Family education, Joint manipulation, Joint mobilization, Aquatic Therapy, Dry Needling, Cryotherapy, Moist heat, and Manual  therapy.   PLAN FOR NEXT SESSION: Strengthening of left side. Progress hip and core strengthening exercises. Continue to progress parascapular and pallof press.     Bradly Chris PT, DPT   9:39 AM, 10/08/21 Physical Therapist - [place] [phone] (Office)

## 2021-10-22 ENCOUNTER — Ambulatory Visit: Payer: Medicare Other | Admitting: Physical Therapy

## 2021-10-22 ENCOUNTER — Encounter: Payer: Self-pay | Admitting: Physical Therapy

## 2021-10-22 DIAGNOSIS — M546 Pain in thoracic spine: Secondary | ICD-10-CM | POA: Diagnosis not present

## 2021-10-22 DIAGNOSIS — G8929 Other chronic pain: Secondary | ICD-10-CM

## 2021-10-22 DIAGNOSIS — M6281 Muscle weakness (generalized): Secondary | ICD-10-CM

## 2021-10-22 NOTE — Therapy (Addendum)
OUTPATIENT PHYSICAL THERAPY DISCHARGE NOTE/ Re-certification  Discharge Summary: Patient has since decided to discharge from PT, because of improvements in left sided hip pain after trailing heel inserts.   Dates of Reporting: 10/16/21-12/16/21  Patient Name: Tiffany Costa MRN: [id number] DOB:May 11, 1955, 66 y.o., female Today's Date: 11/12/2021  PCP: Gladstone Lighter, MD REFERRING PROVIDER: Diamond Nickel, DO  END OF SESSION:   PT End of Session - 10/08/21 0938     Visit Number 12   Number of Visits 16    Date for PT Re-Evaluation 10/15/21    Authorization Type Medicare 2023    Authorization Time Period  10/16/21-12/16/21   Progress Note Due on Visit 10    PT Start Time 0935    PT Stop Time 1015    PT Time Calculation (min) 40 min    Activity Tolerance Patient tolerated treatment well    Behavior During Therapy [facility] for tasks assessed/performed              Past Medical History:  Diagnosis Date   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[place])    Blood transfusion without reported diagnosis    Cancer ([place])    breast cancer   Heart murmur    Hyperfunctioning follicular adenoma of thyroid gland    Hyperlipidemia    Migraines    Personal history of radiation therapy 2010   Urine incontinence    Past Surgical History:  Procedure Laterality Date   ABDOMINAL HYSTERECTOMY     BREAST LUMPECTOMY Right 2010   LYMPHADENECTOMY     WRIST FRACTURE SURGERY  2017   Patient Active Problem List   Diagnosis Date Noted   Acute non-recurrent maxillary sinusitis 01/28/2018   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 female that presents to clinic today for  evaluation and management of back pain at the referral of Radhika, Tressia Miners, MD. She has previously been established with our group but this is a new problem.    At the time of this visit, I reviewed her most recent evaluation by her PCP from 05/13/2021 for chronic low back and flank pain. She had previously been treated for a UTI in October 2022. She is known to have scoliosis. At that visit, she was recommended CT abdomen/pelvis with contrast which showed some diverticulosis and thoracolumbar scoliosis. She was called about these results and so she was recommended to have x-rays of the spine. She was also put on gabapentin. She had entire spine x-rays on 06/06/2021 which showed sigmoid thoracolumbar scoliosis with multiple level degenerative disc and facet changes. She was referred to orthopedics for this issue. Her most recent labs from 01/16/2021 show creatinine 0.8, normal electrolytes, normal TSH, normal vitamin D. She did have a urinalysis on 02/13/2021 that was slightly cloudy with positive nitrites, trace leukocyte Estrace, many bacteria, but no red blood cells/ketones/glucose.   Her pain began suddenly approximately 8 months ago with no acute trauma or injury. She denies any previous accident, injury, trauma, fall, strain to this area. She does note a remote fall where she hit her face and wonders if this could have exacerbated her pains. The pain is located over the left flank and radiates intermittently to the  left abdomen. She denies any radiation of symptoms into her legs She describes her pain as intermittent, worsening, sharp, dull, throbbing, aching, nagging, intense, stabbing, clenching, and feeling like a "spasm". It is aggravated by laying flat on her back at night and is worse at night. She currently rates pain severity as a 6/10. She reports associated pain at night. She denies associated swelling, bruising, skin color change, locking, catching, clicking, instability, limping, difficulty  walking, numbness/tingling, weakness, fever/chills, nausea, night sweats, weight loss. She has tried Tylenol and Celecoxib. She also notes that using a more firm mattress has helped at times. She took gabapentin for 1 week but felt that it made her too drowsy in the morning so she stopped it.   PRECAUTIONS: None   SUBJECTIVE: Pt reports feeling a decrease in her pain since trying out orthotics. She is using [employer] orthotics and will be re-evaluated within the next 6 weeks by orthotics representative.    PAIN:  Are you having pain? No Pain   OBJECTIVE:                           DIAGNOSTIC FINDINGS:  EXAM: Lumbar Spine Radiographs - 2 views (AP, Lateral) performed 06/06/2021   FINDINGS:  There are 5 lumbar type vertebrae without evidence of compression injury. Normal lumbar lordosis. Moderate, sigmoid thoracolumbar scoliosis scoliosis. No listhesis. Multilevel degenerative disc and facet changes.      PATIENT SURVEYS:  FOTO 60            10/01/21: 57   Objective Tests and Measures.                             LUMBAR ROM:    Active  A/PROM  08/20/2021  Flexion 100%  Extension 100%  Right lateral flexion 100%  Left lateral flexion 100%  Right rotation 100%  Left rotation 100%   (Blank rows = not tested)   LE ROM:   Active  Right 08/20/2021 Left 08/20/2021  Hip flexion 120 120  Hip extension 30 30  Hip abduction 45 45  Hip adduction 30 30  Hip internal rotation 45 45  Hip external rotation 45 45  Knee flexion 135 135  Knee extension 0 0  Ankle dorsiflexion 20 20  Ankle plantarflexion 50 50  Ankle inversion      Ankle eversion       (Blank rows = not tested)   LE MMT:   MMT Right 08/20/2021 Left 08/20/2021 Right  10/01/21 Left    Hip flexion 5 5    Hip extension 4 4 4+ 5  Hip abduction '4 4 5 5  '$ Hip adduction '4 4 5 5  '$ Hip internal rotation 5 5    Hip external rotation '5 4  5  '$ Knee flexion 5 5    Knee extension 5 5    Ankle dorsiflexion 5 5    Ankle  plantarflexion        Ankle inversion        Ankle eversion         (Blank rows = not tested)        LUMBAR SPECIAL TESTS:  Straight leg raise test: Negative and Slump test: Negative   TODAY'S TREATMENT  10/22/21  TM 2.0 mph for 5 min  Leg Length Discrepancy R/L 30.5"/30" Figure 4 Bridges on RLE 1 x 10  Single Leg Sit to Stand  2 x 10                        Standing Pallof Press with Blue TB 2 x 10   10/08/21 TM 2.5 mph for 6 min   Pallof Press 3 x 10 with #5 on [place] with Blue TB 2 x 10   Quadruped T's with #3 DB 3 x 10    10/01/21 TM 5 min at 2.5 mph  FOTO 57 Hip MMT:     -Hip Ext R/L 4+/5     -Hip Abd R/L 5/5     -Hip Add R/L 5/5     -Hip Ext L 5  Review of frequency and rationale for extending plan of care. Discussed HEP compliance and need to hit frequency for therapeutic benefit.        PATIENT EDUCATION:  Education details: form and technique for appropriate exercise. Explanation about benefits of PT to improve back pain.  Person educated: Patient Education method: Explanation, Demonstration, Verbal cues, and Handouts Education comprehension: verbalized understanding, verbal cues required, tactile cues required, and needs further education     HOME EXERCISE PROGRAM: Access Code: [email or web address] URL: [email or web address] Date: 10/22/2021 Prepared by: Bradly Chris  Exercises - Seated Quadratus Lumborum Stretch in Chair  - 1 x daily - 7 x weekly - 1 sets - 3 reps - 30 hold - Sidelying Hip Abduction  - 1 x daily - 3 x weekly - 3 sets - 10 reps - Sidelying Hip Adduction  - 1 x daily - 3 x weekly - 3 sets - 10 reps - Shoulder Bridge Prep with Continuous Marching  - 1 x daily - 3 x weekly - 3 sets - 8 reps - Standing Anti-Rotation Press with Anchored Resistance  - 1 x daily - 3 x weekly - 3 sets - 10 reps - 2 hold - Single Leg Sit to Stand with Arms Extended  - 1 x daily - 3 x weekly - 3 sets - 10 reps   ASSESSMENT:    CLINICAL IMPRESSION:  Pt exhibits marked improvement in pain response to activity which can be largely attributed to new orthotics. She was able to complete all exercises without an increase in her pain. HEP modified to included additional hip strengthening exercise to improve hip strength. If pt continues to have reduced pain next session, then will likely discharge as this is the last remaining goal.     OBJECTIVE IMPAIRMENTS decreased strength, postural dysfunction, and pain.    ACTIVITY LIMITATIONS laundry, shopping, and yard work.    PERSONAL FACTORS 1 comorbidity: Scoliosis   are also affecting patient's functional outcome.      REHAB POTENTIAL: Good   CLINICAL DECISION MAKING: Stable/uncomplicated   EVALUATION COMPLEXITY: Low     GOALS: Goals reviewed with patient? No   SHORT TERM GOALS: Target date: 09/03/2021   Pt will be independent with HEP in order to improve strength and balance in order to decrease fall risk and improve function at home and work.   Baseline: Able to perform exercises independently  Goal status: ONGOING        LONG TERM GOALS: Target date: 10/15/2021   Patient will have improved function and activity level as evidenced by an increase in FOTO score by 10 points or more.  Baseline: 60, 10/01/21: 57/64 Goal status: ONGOING    2.  Patient will improve hip strength by 1/2>= grade MMT to provide increased stability  for spine to avoid spine absorbing abnormal forces. Baseline: Hip Ext R/L 4/4, Hip Abd R/L 4/4, Hip Aduc R/L 4/4, Hip ER R 4                              10/01/21 Hip Abd R/L 5/5 Hip Add R/L 5/5 Hip Ext 4+/5   Goal status: ACHIEVED          PLAN: PT FREQUENCY: 1-2x/2 week   PT DURATION: 6 weeks   PLANNED INTERVENTIONS: Therapeutic exercises, Therapeutic activity, Neuromuscular re-education, Balance training, Gait training, Patient/Family education, Joint manipulation, Joint mobilization, Aquatic Therapy, Dry Needling, Cryotherapy,  Moist heat, and Manual therapy.   PLAN FOR NEXT SESSION: Strengthening and stretching of left side. Progress hip and core strengthening exercises. Continue to progress parascapular.    Bradly Chris PT, DPT   11:32 AM, 11/12/21 Physical Therapist - [place] [phone] (Office)

## 2021-11-07 ENCOUNTER — Telehealth: Payer: Self-pay | Admitting: Physical Therapy

## 2021-11-07 ENCOUNTER — Ambulatory Visit: Payer: Medicare Other | Attending: Sports Medicine | Admitting: Physical Therapy

## 2021-11-07 NOTE — Telephone Encounter (Signed)
Called pt to inquiry about absence and whether she still needed PT. Did not reach so left VM instructing pt to call back.

## 2022-05-13 ENCOUNTER — Other Ambulatory Visit: Payer: Self-pay | Admitting: Internal Medicine

## 2022-05-13 DIAGNOSIS — Z1231 Encounter for screening mammogram for malignant neoplasm of breast: Secondary | ICD-10-CM

## 2022-05-15 ENCOUNTER — Ambulatory Visit
Admission: RE | Admit: 2022-05-15 | Discharge: 2022-05-15 | Disposition: A | Discharge status: Home / Self Care | Payer: Medicare Other | Source: Ambulatory Visit | Attending: Internal Medicine | Admitting: Internal Medicine

## 2022-05-15 ENCOUNTER — Ambulatory Visit: Payer: Medicare Other

## 2022-05-15 DIAGNOSIS — Z1231 Encounter for screening mammogram for malignant neoplasm of breast: Secondary | ICD-10-CM

## 2023-05-13 ENCOUNTER — Encounter: Payer: Self-pay | Admitting: Internal Medicine

## 2023-05-13 ENCOUNTER — Other Ambulatory Visit: Payer: Self-pay | Admitting: Internal Medicine

## 2023-05-13 DIAGNOSIS — Z0000 Encounter for general adult medical examination without abnormal findings: Secondary | ICD-10-CM

## 2023-05-13 DIAGNOSIS — Z1231 Encounter for screening mammogram for malignant neoplasm of breast: Secondary | ICD-10-CM

## 2023-05-26 ENCOUNTER — Ambulatory Visit: Payer: Medicare Other

## 2023-06-02 ENCOUNTER — Ambulatory Visit
Admission: RE | Admit: 2023-06-02 | Discharge: 2023-06-02 | Disposition: A | Discharge status: Home / Self Care | Payer: Medicare Other | Source: Ambulatory Visit | Attending: Internal Medicine | Admitting: Internal Medicine

## 2023-06-02 DIAGNOSIS — Z0000 Encounter for general adult medical examination without abnormal findings: Secondary | ICD-10-CM

## 2024-05-09 ENCOUNTER — Other Ambulatory Visit: Payer: Self-pay | Admitting: Internal Medicine

## 2024-05-09 DIAGNOSIS — Z1231 Encounter for screening mammogram for malignant neoplasm of breast: Secondary | ICD-10-CM

## 2024-06-02 ENCOUNTER — Ambulatory Visit
Admission: RE | Admit: 2024-06-02 | Discharge: 2024-06-02 | Disposition: A | Discharge status: Home / Self Care | Source: Ambulatory Visit | Attending: Internal Medicine | Admitting: Internal Medicine

## 2024-06-02 DIAGNOSIS — Z1231 Encounter for screening mammogram for malignant neoplasm of breast: Secondary | ICD-10-CM
